# Patient Record
Sex: Male | Born: 1945 | Race: White | Hispanic: No | Marital: Married | Smoking: Former smoker
Health system: Southern US, Community
[De-identification: ages and names within clinical notes are randomized; demographics above are authoritative.]

## PROBLEM LIST (undated history)

## (undated) DIAGNOSIS — I2510 Atherosclerotic heart disease of native coronary artery without angina pectoris: Secondary | ICD-10-CM

## (undated) DIAGNOSIS — E785 Hyperlipidemia, unspecified: Secondary | ICD-10-CM

## (undated) HISTORY — DX: Hyperlipidemia, unspecified: E78.5

## (undated) HISTORY — DX: Atherosclerotic heart disease of native coronary artery without angina pectoris: I25.10

---

## 2003-12-10 ENCOUNTER — Encounter (INDEPENDENT_AMBULATORY_CARE_PROVIDER_SITE_OTHER): Payer: Self-pay | Admitting: *Deleted

## 2003-12-10 ENCOUNTER — Ambulatory Visit (HOSPITAL_BASED_OUTPATIENT_CLINIC_OR_DEPARTMENT_OTHER): Admission: RE | Admit: 2003-12-10 | Discharge: 2003-12-10 | Payer: Self-pay | Admitting: Surgery

## 2003-12-10 ENCOUNTER — Ambulatory Visit (HOSPITAL_COMMUNITY): Admission: RE | Admit: 2003-12-10 | Discharge: 2003-12-10 | Payer: Self-pay | Admitting: Surgery

## 2004-02-08 ENCOUNTER — Encounter (INDEPENDENT_AMBULATORY_CARE_PROVIDER_SITE_OTHER): Payer: Self-pay | Admitting: Specialist

## 2004-02-08 ENCOUNTER — Ambulatory Visit (HOSPITAL_COMMUNITY): Admission: RE | Admit: 2004-02-08 | Discharge: 2004-02-08 | Payer: Self-pay | Admitting: Gastroenterology

## 2006-01-04 ENCOUNTER — Emergency Department (HOSPITAL_COMMUNITY): Admission: EM | Admit: 2006-01-04 | Discharge: 2006-01-04 | Payer: Self-pay | Admitting: Emergency Medicine

## 2006-01-08 ENCOUNTER — Ambulatory Visit (HOSPITAL_COMMUNITY): Admission: RE | Admit: 2006-01-08 | Discharge: 2006-01-08 | Payer: Self-pay | Admitting: Emergency Medicine

## 2006-05-16 ENCOUNTER — Ambulatory Visit (HOSPITAL_BASED_OUTPATIENT_CLINIC_OR_DEPARTMENT_OTHER): Admission: RE | Admit: 2006-05-16 | Discharge: 2006-05-16 | Payer: Self-pay | Admitting: Surgery

## 2009-04-03 ENCOUNTER — Emergency Department (HOSPITAL_COMMUNITY): Admission: EM | Admit: 2009-04-03 | Discharge: 2009-04-03 | Payer: Self-pay | Admitting: Family Medicine

## 2011-03-10 ENCOUNTER — Other Ambulatory Visit: Payer: Self-pay | Admitting: Dermatology

## 2011-03-24 NOTE — Op Note (Signed)
NAME:  Darren Nixon, Darren Nixon                       ACCOUNT NO.:  [id number]   MEDICAL RECORD NO.:  [id number]                   PATIENT TYPE:  AMB   LOCATION:  DSC                                  FACILITY:  [facility]   PHYSICIAN:  Currie Paris, M.D.           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   PROCEDURE:  Excision of recurrent mass posterior neck.   SURGEON:  Currie Paris, M.D.   ANESTHESIA:  MAC.   INDICATIONS FOR PROCEDURE:  This patient is a 65 year old with a gradually  enlarging mass directly under the scar where a prior mass had been excised.  By history this appeared to have been a prior epidermoid cyst.   DESCRIPTION OF PROCEDURE:  The patient was seen in the holding area and had  no further questions.  The neck mass was marked and was directly posteriorly  and under the prior scar.  He was taken to the operating room and placed in  the lateral position with appropriate padding.  He was given IV sedation.  The neck was prepped and draped.  I injected about 10 mL of 1% Xylocaine  with epinephrine mixed equally with 0.5% Marcaine and for local anesthesia.  I waited about five minutes for that to have good effect.  I then made an  elliptical incision around the old scar.  The mass was palpable and I  excised it sharply with the knife staying in what appeared to be normal  fatty tissue.  There was a lot of fibrosis around this and scarring  suggesting chronic inflammatory changes.  The mass was taken out intact and  I really did not get into it, so it was unclear whether this was some  fibrotic scar tissue, recurrent lipoma, or recurrent  epidermoid cyst.  Nevertheless, it appeared to be completely excised.   Everything was dry and closed in layers with 3-0 Vicryl followed by a  running 3-0 Prolene.  Sterile dressings were applied.  The patient tolerated  the procedure well.  There were no intraoperative complications and all  counts were correct.                                               Currie Paris, M.D.   CJS/MEDQ  D:  12/10/2003  T:  12/10/2003  Job:  [id number]   cc:   Dellis Anes. Idell Pickles, M.D.  [address]  [place]  [place] [address]  Fax: [phone]

## 2011-03-24 NOTE — Op Note (Signed)
NAME:  Darren Nixon, Darren Nixon                       ACCOUNT NO.:  [id number]   MEDICAL RECORD NO.:  [id number]                   PATIENT TYPE:  AMB   LOCATION:  ENDO                                 FACILITY:  [facility]   PHYSICIAN:  Graylin Shiver, M.D.                DATE OF BIRTH:  22-Jan-1946   DATE OF PROCEDURE:  02/08/2004  DATE OF DISCHARGE:                                 OPERATIVE REPORT   PROCEDURE:  Colonoscopy with biopsy.   INDICATION:  Screening.   Informed consent was obtained after explanation of the risks of bleeding,  infection, and perforation.   PREMEDICATIONS:  1. Fentanyl 75 mcg IV.  2. Versed 7 mg IV.   DESCRIPTION OF PROCEDURE:  With the patient in the left lateral decubitus  position, a rectal exam was performed; no masses were felt.  The Olympus  colonoscope was inserted into the rectum and advanced around the colon to  the cecum.  Cecal landmarks were identified.  The cecum and ascending colon  were normal.  The transverse colon normal.  The descending colon and sigmoid  were normal.  In the proximal rectum, there was a small 4 mm sessile polyp,  biopsied off with cold forceps.  He tolerated the procedure well without  complications.   IMPRESSION:  Small rectal polyp.  Diagnosis code 211.4.                                               Graylin Shiver, M.D.    Germain Osgood  D:  02/08/2004  T:  02/09/2004  Job:  [id number]   cc:   Dellis Anes. Idell Pickles, M.D.  [address]  [place]  [place] [address]  Fax: [phone]

## 2011-03-24 NOTE — Op Note (Signed)
Darren Nixon, Darren Nixon             ACCOUNT NO.:  [id number]   MEDICAL RECORD NO.:  [id number]          PATIENT TYPE:  AMB   LOCATION:  DSC                          FACILITY:  [facility]   PHYSICIAN:  Currie Paris, M.D.DATE OF BIRTH:  14-Nov-1945   DATE OF PROCEDURE:  05/16/2006  DATE OF DISCHARGE:                                 OPERATIVE REPORT   PREOPERATIVE DIAGNOSIS:  Umbilical hernia.   POSTOPERATIVE DIAGNOSIS:  Umbilical hernia.   OPERATION:  Repair with mesh.   SURGEON:  Currie Paris, M.D.   ANESTHESIA:  General.   CLINICAL HISTORY:  Darren Nixon is a 65 year old gentleman with a reducible  umbilical hernia he wished to have fixed.   DESCRIPTION OF PROCEDURE:  The patient was seen in the holding area and he  had no further questions.  We confirmed umbilical hernia repair as the  planned procedure.   Darren Nixon was taken to the operating room and after satisfactory general  anesthesia (LMA) had been obtained, the abdomen was clipped, prepped and  draped.  The time-out occurred.   I infiltrated 0.25% plain Marcaine around the area to help with  postoperative  analgesia.  I made a curvilinear incision at the base the  umbilicus.  I elevated the umbilical skin off of the hernia sac and omentum  that was protruding through.  I cleaned the omentum off until we had it  freed up enough to be reduced fully.  I freed the fascia up from the  subcutaneous tissue so I could get good visualization of the fascia.  There  appeared to be only a single defect measuring approximately 1.5 cm across.  There was no palpable defect either above or below this.   Once this was all cleaned off and the hernia reduced, I cut a piece of mesh  and fashioned it into a cone and placed it into the defect.  The repair was  then accomplished using four sutures of 0 Prolene, putting each of those  through the top of the mesh plug so that would be incorporated into the  repair.  Everything  appeared to be dry.  I closed with  some 3-0 Vicryl, 4-0 Monocryl subcuticular.  A sterile dressing was applied  using mineral oil and a cotton ball to cover the umbilicus.  The patient  tolerated the procedure well and there were no operative complications.  All  counts were correct.      Currie Paris, M.D.  Electronically Signed     CJS/MEDQ  D:  05/16/2006  T:  05/16/2006  Job:  [id number]   cc:   L. Lupe Carney, M.D.  Fax: [phone]

## 2012-03-15 ENCOUNTER — Other Ambulatory Visit: Payer: Self-pay | Admitting: Dermatology

## 2012-03-28 ENCOUNTER — Other Ambulatory Visit: Payer: Self-pay | Admitting: Dermatology

## 2012-10-17 ENCOUNTER — Other Ambulatory Visit: Payer: Self-pay | Admitting: Dermatology

## 2013-02-10 DIAGNOSIS — H26499 Other secondary cataract, unspecified eye: Secondary | ICD-10-CM | POA: Diagnosis not present

## 2013-02-10 DIAGNOSIS — H18419 Arcus senilis, unspecified eye: Secondary | ICD-10-CM | POA: Diagnosis not present

## 2013-02-10 DIAGNOSIS — H11159 Pinguecula, unspecified eye: Secondary | ICD-10-CM | POA: Diagnosis not present

## 2013-02-10 DIAGNOSIS — H35379 Puckering of macula, unspecified eye: Secondary | ICD-10-CM | POA: Diagnosis not present

## 2013-05-15 ENCOUNTER — Other Ambulatory Visit: Payer: Self-pay | Admitting: Dermatology

## 2013-05-15 DIAGNOSIS — L989 Disorder of the skin and subcutaneous tissue, unspecified: Secondary | ICD-10-CM | POA: Diagnosis not present

## 2013-05-15 DIAGNOSIS — L570 Actinic keratosis: Secondary | ICD-10-CM | POA: Diagnosis not present

## 2013-05-15 DIAGNOSIS — D2370 Other benign neoplasm of skin of unspecified lower limb, including hip: Secondary | ICD-10-CM | POA: Diagnosis not present

## 2013-05-15 DIAGNOSIS — D239 Other benign neoplasm of skin, unspecified: Secondary | ICD-10-CM | POA: Diagnosis not present

## 2013-05-15 DIAGNOSIS — L919 Hypertrophic disorder of the skin, unspecified: Secondary | ICD-10-CM | POA: Diagnosis not present

## 2013-05-15 DIAGNOSIS — Z85828 Personal history of other malignant neoplasm of skin: Secondary | ICD-10-CM | POA: Diagnosis not present

## 2013-05-15 DIAGNOSIS — D485 Neoplasm of uncertain behavior of skin: Secondary | ICD-10-CM | POA: Diagnosis not present

## 2013-05-23 DIAGNOSIS — K1370 Unspecified lesions of oral mucosa: Secondary | ICD-10-CM | POA: Diagnosis not present

## 2013-09-16 DIAGNOSIS — H35379 Puckering of macula, unspecified eye: Secondary | ICD-10-CM | POA: Diagnosis not present

## 2013-10-16 DIAGNOSIS — B009 Herpesviral infection, unspecified: Secondary | ICD-10-CM | POA: Diagnosis not present

## 2013-10-16 DIAGNOSIS — Z0000 Encounter for general adult medical examination without abnormal findings: Secondary | ICD-10-CM | POA: Diagnosis not present

## 2013-10-16 DIAGNOSIS — M159 Polyosteoarthritis, unspecified: Secondary | ICD-10-CM | POA: Diagnosis not present

## 2013-10-16 DIAGNOSIS — E782 Mixed hyperlipidemia: Secondary | ICD-10-CM | POA: Diagnosis not present

## 2013-10-16 DIAGNOSIS — Z23 Encounter for immunization: Secondary | ICD-10-CM | POA: Diagnosis not present

## 2013-10-16 DIAGNOSIS — F430 Acute stress reaction: Secondary | ICD-10-CM | POA: Diagnosis not present

## 2013-10-16 DIAGNOSIS — N529 Male erectile dysfunction, unspecified: Secondary | ICD-10-CM | POA: Diagnosis not present

## 2013-10-16 DIAGNOSIS — Z125 Encounter for screening for malignant neoplasm of prostate: Secondary | ICD-10-CM | POA: Diagnosis not present

## 2013-10-24 ENCOUNTER — Other Ambulatory Visit: Payer: Self-pay | Admitting: Family Medicine

## 2013-10-24 DIAGNOSIS — Z87891 Personal history of nicotine dependence: Secondary | ICD-10-CM

## 2013-11-03 ENCOUNTER — Ambulatory Visit: Payer: Self-pay

## 2013-11-05 ENCOUNTER — Ambulatory Visit
Admission: RE | Admit: 2013-11-05 | Discharge: 2013-11-05 | Disposition: A | Discharge status: Home / Self Care | Payer: Medicare Other | Source: Ambulatory Visit | Attending: Family Medicine | Admitting: Family Medicine

## 2013-11-05 DIAGNOSIS — Z136 Encounter for screening for cardiovascular disorders: Secondary | ICD-10-CM | POA: Diagnosis not present

## 2013-11-05 DIAGNOSIS — Z87891 Personal history of nicotine dependence: Secondary | ICD-10-CM

## 2013-11-05 DIAGNOSIS — I77811 Abdominal aortic ectasia: Secondary | ICD-10-CM | POA: Diagnosis not present

## 2014-01-23 DIAGNOSIS — E782 Mixed hyperlipidemia: Secondary | ICD-10-CM | POA: Diagnosis not present

## 2014-03-28 DIAGNOSIS — S239XXA Sprain of unspecified parts of thorax, initial encounter: Secondary | ICD-10-CM | POA: Diagnosis not present

## 2014-03-28 DIAGNOSIS — M999 Biomechanical lesion, unspecified: Secondary | ICD-10-CM | POA: Diagnosis not present

## 2014-03-28 DIAGNOSIS — S335XXA Sprain of ligaments of lumbar spine, initial encounter: Secondary | ICD-10-CM | POA: Diagnosis not present

## 2014-03-28 DIAGNOSIS — S338XXA Sprain of other parts of lumbar spine and pelvis, initial encounter: Secondary | ICD-10-CM | POA: Diagnosis not present

## 2014-03-31 DIAGNOSIS — S239XXA Sprain of unspecified parts of thorax, initial encounter: Secondary | ICD-10-CM | POA: Diagnosis not present

## 2014-03-31 DIAGNOSIS — L570 Actinic keratosis: Secondary | ICD-10-CM | POA: Diagnosis not present

## 2014-03-31 DIAGNOSIS — B009 Herpesviral infection, unspecified: Secondary | ICD-10-CM | POA: Diagnosis not present

## 2014-03-31 DIAGNOSIS — L821 Other seborrheic keratosis: Secondary | ICD-10-CM | POA: Diagnosis not present

## 2014-03-31 DIAGNOSIS — S338XXA Sprain of other parts of lumbar spine and pelvis, initial encounter: Secondary | ICD-10-CM | POA: Diagnosis not present

## 2014-03-31 DIAGNOSIS — L989 Disorder of the skin and subcutaneous tissue, unspecified: Secondary | ICD-10-CM | POA: Diagnosis not present

## 2014-03-31 DIAGNOSIS — S335XXA Sprain of ligaments of lumbar spine, initial encounter: Secondary | ICD-10-CM | POA: Diagnosis not present

## 2014-03-31 DIAGNOSIS — M999 Biomechanical lesion, unspecified: Secondary | ICD-10-CM | POA: Diagnosis not present

## 2014-04-06 DIAGNOSIS — R5381 Other malaise: Secondary | ICD-10-CM | POA: Diagnosis not present

## 2014-04-06 DIAGNOSIS — Z79899 Other long term (current) drug therapy: Secondary | ICD-10-CM | POA: Diagnosis not present

## 2014-04-06 DIAGNOSIS — E559 Vitamin D deficiency, unspecified: Secondary | ICD-10-CM | POA: Diagnosis not present

## 2014-04-06 DIAGNOSIS — R5383 Other fatigue: Secondary | ICD-10-CM | POA: Diagnosis not present

## 2014-04-06 DIAGNOSIS — R635 Abnormal weight gain: Secondary | ICD-10-CM | POA: Diagnosis not present

## 2014-04-06 DIAGNOSIS — R0602 Shortness of breath: Secondary | ICD-10-CM | POA: Diagnosis not present

## 2014-04-07 DIAGNOSIS — S338XXA Sprain of other parts of lumbar spine and pelvis, initial encounter: Secondary | ICD-10-CM | POA: Diagnosis not present

## 2014-04-07 DIAGNOSIS — M999 Biomechanical lesion, unspecified: Secondary | ICD-10-CM | POA: Diagnosis not present

## 2014-04-07 DIAGNOSIS — S335XXA Sprain of ligaments of lumbar spine, initial encounter: Secondary | ICD-10-CM | POA: Diagnosis not present

## 2014-04-07 DIAGNOSIS — S239XXA Sprain of unspecified parts of thorax, initial encounter: Secondary | ICD-10-CM | POA: Diagnosis not present

## 2014-05-11 DIAGNOSIS — R635 Abnormal weight gain: Secondary | ICD-10-CM | POA: Diagnosis not present

## 2014-06-15 DIAGNOSIS — R635 Abnormal weight gain: Secondary | ICD-10-CM | POA: Diagnosis not present

## 2014-07-20 DIAGNOSIS — L570 Actinic keratosis: Secondary | ICD-10-CM | POA: Diagnosis not present

## 2014-07-20 DIAGNOSIS — L821 Other seborrheic keratosis: Secondary | ICD-10-CM | POA: Diagnosis not present

## 2014-07-20 DIAGNOSIS — D239 Other benign neoplasm of skin, unspecified: Secondary | ICD-10-CM | POA: Diagnosis not present

## 2014-07-20 DIAGNOSIS — Z85828 Personal history of other malignant neoplasm of skin: Secondary | ICD-10-CM | POA: Diagnosis not present

## 2014-10-26 DIAGNOSIS — Z125 Encounter for screening for malignant neoplasm of prostate: Secondary | ICD-10-CM | POA: Diagnosis not present

## 2014-10-26 DIAGNOSIS — M150 Primary generalized (osteo)arthritis: Secondary | ICD-10-CM | POA: Diagnosis not present

## 2014-10-26 DIAGNOSIS — Z0000 Encounter for general adult medical examination without abnormal findings: Secondary | ICD-10-CM | POA: Diagnosis not present

## 2014-10-26 DIAGNOSIS — E782 Mixed hyperlipidemia: Secondary | ICD-10-CM | POA: Diagnosis not present

## 2014-10-26 DIAGNOSIS — K635 Polyp of colon: Secondary | ICD-10-CM | POA: Diagnosis not present

## 2014-10-26 DIAGNOSIS — N529 Male erectile dysfunction, unspecified: Secondary | ICD-10-CM | POA: Diagnosis not present

## 2014-10-26 DIAGNOSIS — Z23 Encounter for immunization: Secondary | ICD-10-CM | POA: Diagnosis not present

## 2014-10-26 DIAGNOSIS — F419 Anxiety disorder, unspecified: Secondary | ICD-10-CM | POA: Diagnosis not present

## 2014-12-02 DIAGNOSIS — J3489 Other specified disorders of nose and nasal sinuses: Secondary | ICD-10-CM | POA: Diagnosis not present

## 2015-02-04 DIAGNOSIS — H43399 Other vitreous opacities, unspecified eye: Secondary | ICD-10-CM | POA: Diagnosis not present

## 2015-02-04 DIAGNOSIS — H524 Presbyopia: Secondary | ICD-10-CM | POA: Diagnosis not present

## 2015-02-04 DIAGNOSIS — H43819 Vitreous degeneration, unspecified eye: Secondary | ICD-10-CM | POA: Diagnosis not present

## 2015-02-04 DIAGNOSIS — H4389 Other disorders of vitreous body: Secondary | ICD-10-CM | POA: Diagnosis not present

## 2015-08-15 IMAGING — US US AORTA SCREENING (MEDICARE)
1 series · 13 of 13 positions shown · non-contrast
Comparison: None.

CLINICAL DATA: Screening, asymptomatic, previous tobacco abuse.

EXAM:
ABDOMINAL AORTA SCREENING ULTRASOUND
TECHNIQUE: Ultrasound examination of the abdominal aorta was performed as a
screening evaluation for abdominal aortic aneurysm.

[Series 1: us aorta screening (medicare) · 0.37mm/px · 13 of 13 slices shown]
[im 1/13]
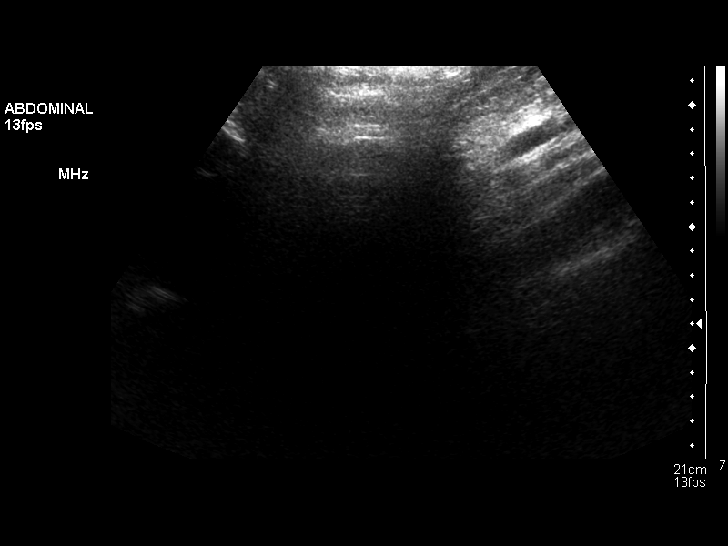
[im 2/13]
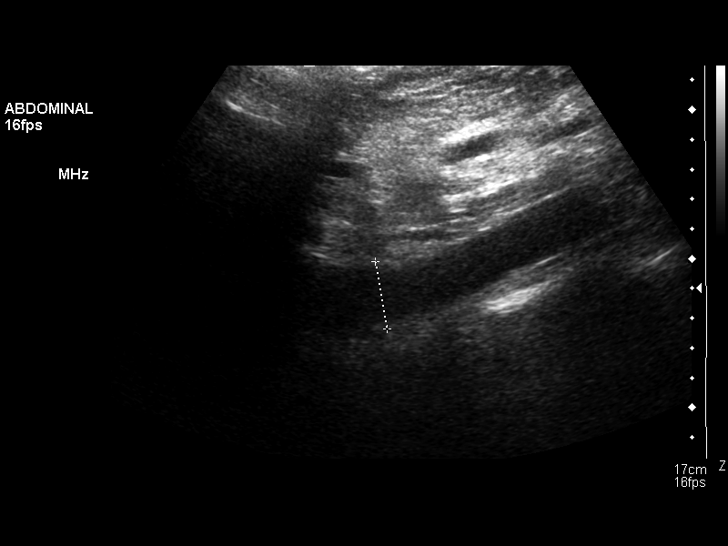
[im 3/13]
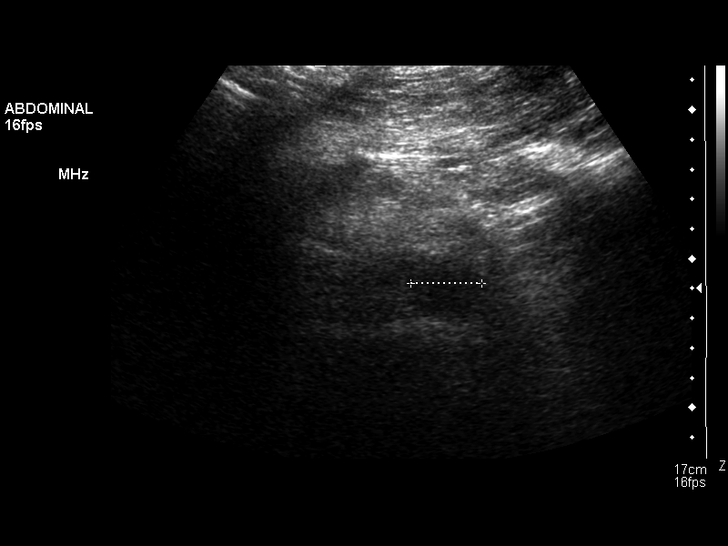
[im 4/13]
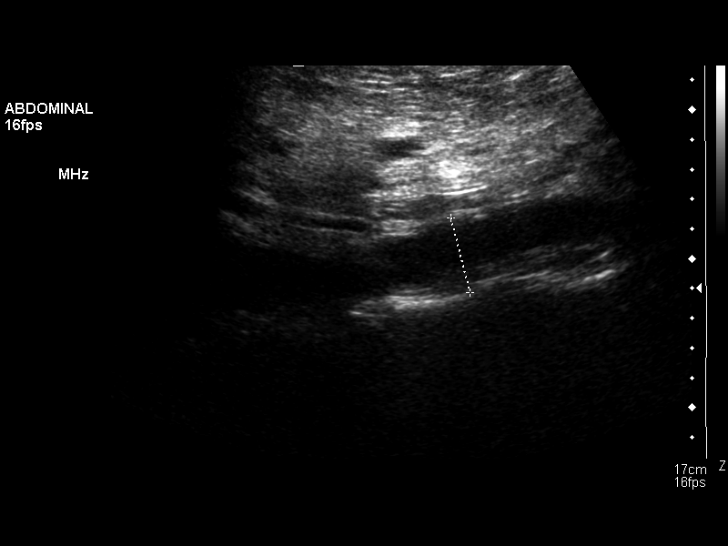
[im 5/13]
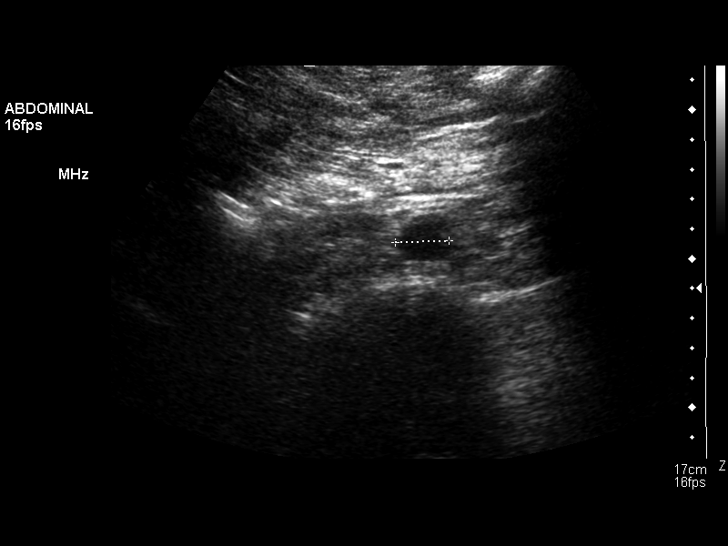
[im 6/13]
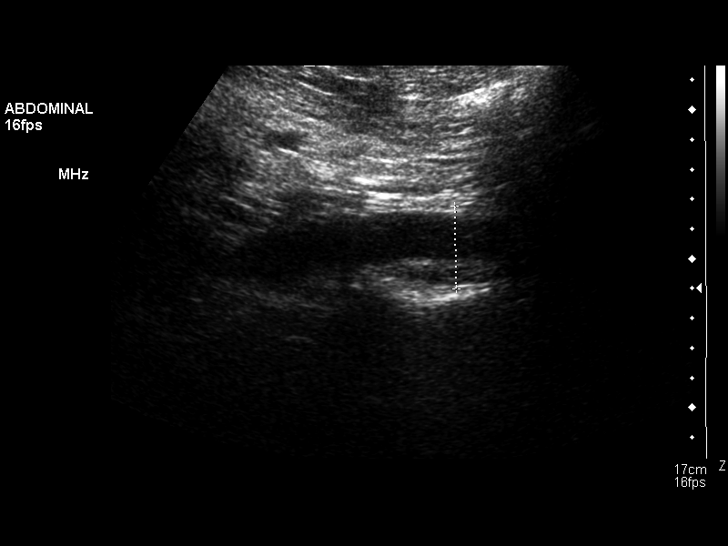
[im 7/13]
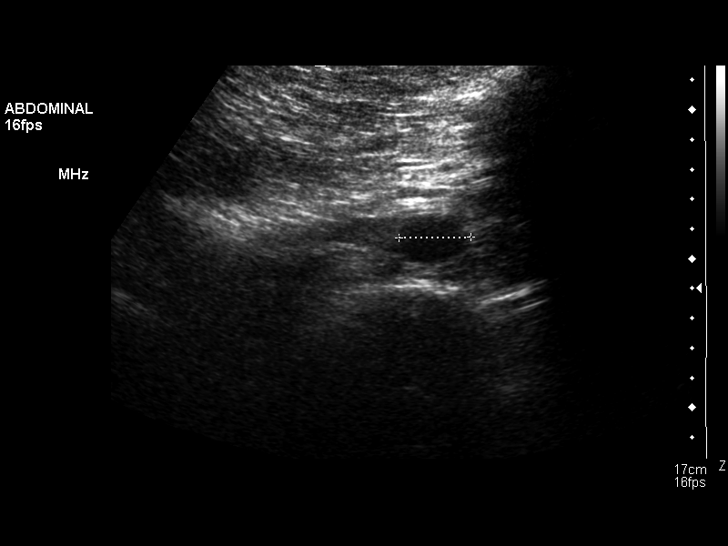
[im 8/13]
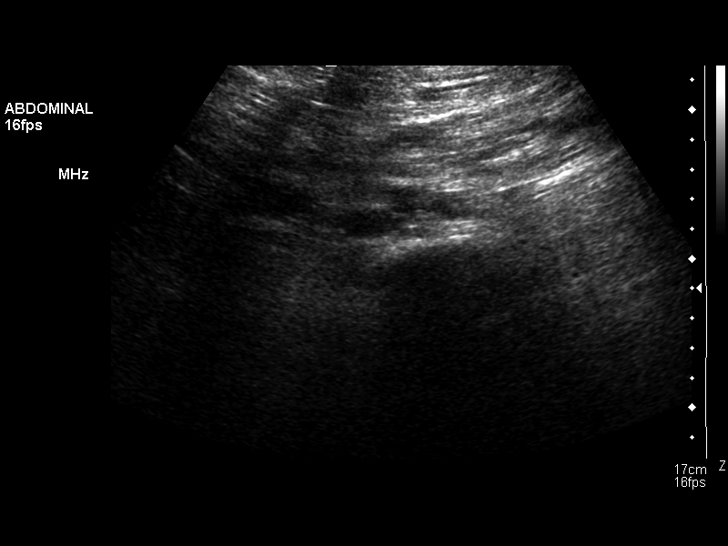
[im 9/13]
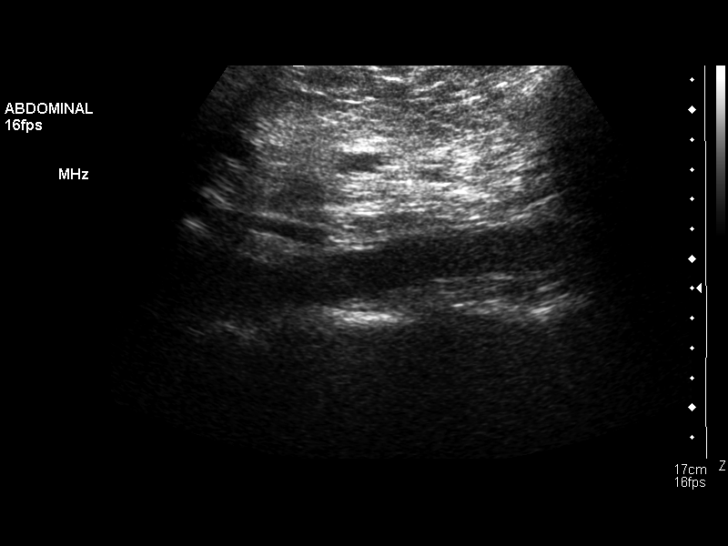
[im 10/13]
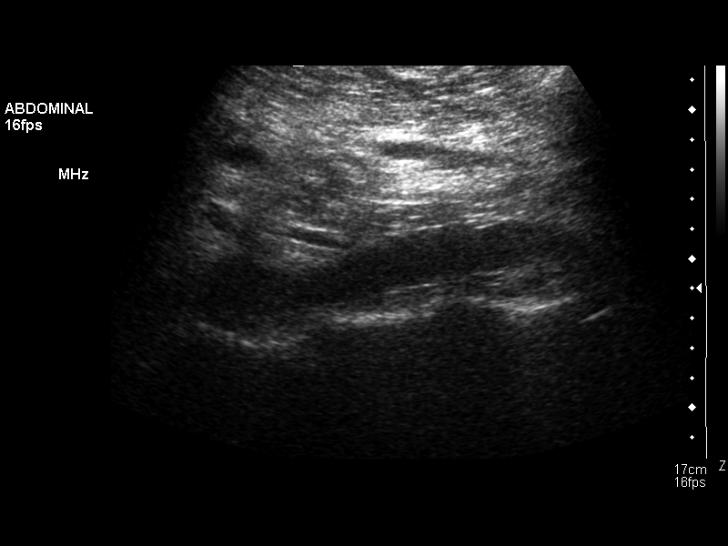
[im 11/13]
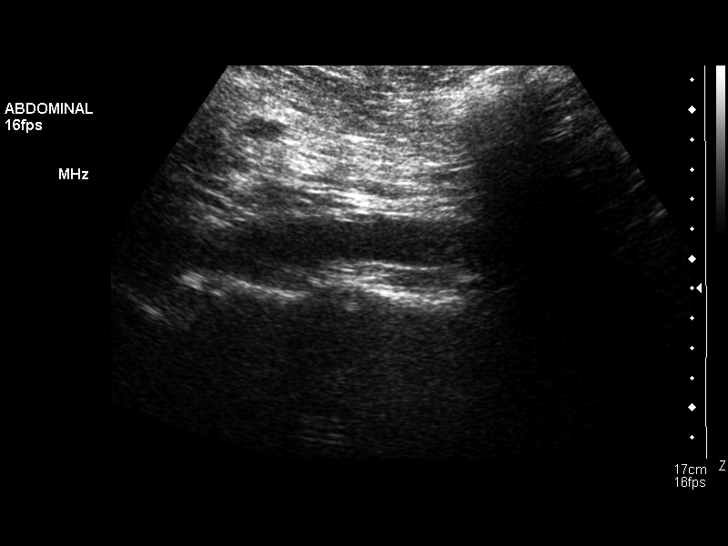
[im 12/13]
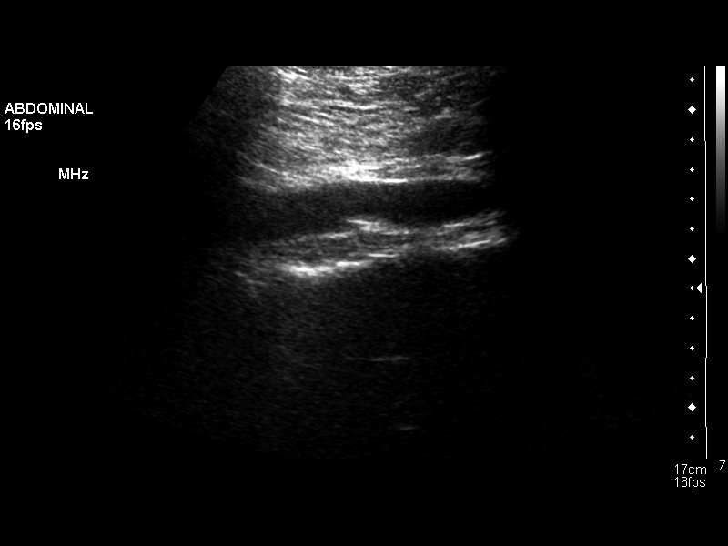
[im 13/13]
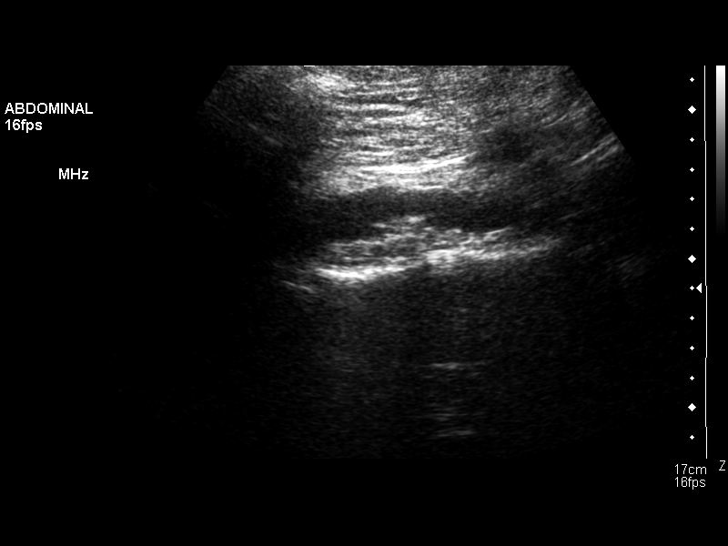

[13 of 13 positions shown; findings below may reference images not displayed]

FINDINGS: Abdominal Aorta: Fusiform distal segment. Scattered atheromatous
plaque.

Maximum AP diameter:  Distal abdominal aorta, 2.8 cm

Maximum TRV diameter:   2.4 cm
IMPRESSION: Ectatic abdominal aorta at risk for aneurysm development. Recommend
follow up by US in 5 years. This recommendation follows ACR
consensus guidelines: White Paper of the ACR Incidental Findings
Committee II on Vascular Findings. [HOSPITAL] 1138;

## 2015-08-17 DIAGNOSIS — L821 Other seborrheic keratosis: Secondary | ICD-10-CM | POA: Diagnosis not present

## 2015-08-17 DIAGNOSIS — D2272 Melanocytic nevi of left lower limb, including hip: Secondary | ICD-10-CM | POA: Diagnosis not present

## 2015-08-17 DIAGNOSIS — D225 Melanocytic nevi of trunk: Secondary | ICD-10-CM | POA: Diagnosis not present

## 2015-08-17 DIAGNOSIS — L570 Actinic keratosis: Secondary | ICD-10-CM | POA: Diagnosis not present

## 2015-08-17 DIAGNOSIS — Z85828 Personal history of other malignant neoplasm of skin: Secondary | ICD-10-CM | POA: Diagnosis not present

## 2015-10-08 DIAGNOSIS — B349 Viral infection, unspecified: Secondary | ICD-10-CM | POA: Diagnosis not present

## 2015-11-03 DIAGNOSIS — Z125 Encounter for screening for malignant neoplasm of prostate: Secondary | ICD-10-CM | POA: Diagnosis not present

## 2015-11-03 DIAGNOSIS — Z23 Encounter for immunization: Secondary | ICD-10-CM | POA: Diagnosis not present

## 2015-11-03 DIAGNOSIS — M150 Primary generalized (osteo)arthritis: Secondary | ICD-10-CM | POA: Diagnosis not present

## 2015-11-03 DIAGNOSIS — E782 Mixed hyperlipidemia: Secondary | ICD-10-CM | POA: Diagnosis not present

## 2015-11-03 DIAGNOSIS — Z0000 Encounter for general adult medical examination without abnormal findings: Secondary | ICD-10-CM | POA: Diagnosis not present

## 2015-11-03 DIAGNOSIS — F419 Anxiety disorder, unspecified: Secondary | ICD-10-CM | POA: Diagnosis not present

## 2015-11-10 DIAGNOSIS — L814 Other melanin hyperpigmentation: Secondary | ICD-10-CM | POA: Diagnosis not present

## 2015-11-10 DIAGNOSIS — L821 Other seborrheic keratosis: Secondary | ICD-10-CM | POA: Diagnosis not present

## 2015-11-10 DIAGNOSIS — L570 Actinic keratosis: Secondary | ICD-10-CM | POA: Diagnosis not present

## 2015-11-10 DIAGNOSIS — Z23 Encounter for immunization: Secondary | ICD-10-CM | POA: Diagnosis not present

## 2015-12-09 DIAGNOSIS — M5134 Other intervertebral disc degeneration, thoracic region: Secondary | ICD-10-CM | POA: Diagnosis not present

## 2015-12-09 DIAGNOSIS — M5137 Other intervertebral disc degeneration, lumbosacral region: Secondary | ICD-10-CM | POA: Diagnosis not present

## 2015-12-09 DIAGNOSIS — M9903 Segmental and somatic dysfunction of lumbar region: Secondary | ICD-10-CM | POA: Diagnosis not present

## 2015-12-09 DIAGNOSIS — M9905 Segmental and somatic dysfunction of pelvic region: Secondary | ICD-10-CM | POA: Diagnosis not present

## 2015-12-09 DIAGNOSIS — M5431 Sciatica, right side: Secondary | ICD-10-CM | POA: Diagnosis not present

## 2015-12-09 DIAGNOSIS — M9902 Segmental and somatic dysfunction of thoracic region: Secondary | ICD-10-CM | POA: Diagnosis not present

## 2016-05-15 DIAGNOSIS — E782 Mixed hyperlipidemia: Secondary | ICD-10-CM | POA: Diagnosis not present

## 2016-05-16 DIAGNOSIS — L814 Other melanin hyperpigmentation: Secondary | ICD-10-CM | POA: Diagnosis not present

## 2016-05-16 DIAGNOSIS — L570 Actinic keratosis: Secondary | ICD-10-CM | POA: Diagnosis not present

## 2016-05-16 DIAGNOSIS — L821 Other seborrheic keratosis: Secondary | ICD-10-CM | POA: Diagnosis not present

## 2016-08-15 DIAGNOSIS — Z23 Encounter for immunization: Secondary | ICD-10-CM | POA: Diagnosis not present

## 2016-09-29 DIAGNOSIS — J069 Acute upper respiratory infection, unspecified: Secondary | ICD-10-CM | POA: Diagnosis not present

## 2016-09-29 DIAGNOSIS — R05 Cough: Secondary | ICD-10-CM | POA: Diagnosis not present

## 2016-11-07 ENCOUNTER — Ambulatory Visit
Admission: RE | Admit: 2016-11-07 | Discharge: 2016-11-07 | Disposition: A | Discharge status: Home / Self Care | Payer: Medicare Other | Source: Ambulatory Visit | Attending: Family Medicine | Admitting: Family Medicine

## 2016-11-07 ENCOUNTER — Other Ambulatory Visit: Payer: Self-pay | Admitting: Family Medicine

## 2016-11-07 DIAGNOSIS — Z125 Encounter for screening for malignant neoplasm of prostate: Secondary | ICD-10-CM | POA: Diagnosis not present

## 2016-11-07 DIAGNOSIS — N5089 Other specified disorders of the male genital organs: Secondary | ICD-10-CM | POA: Diagnosis not present

## 2016-11-07 DIAGNOSIS — R609 Edema, unspecified: Secondary | ICD-10-CM

## 2016-11-07 DIAGNOSIS — G5622 Lesion of ulnar nerve, left upper limb: Secondary | ICD-10-CM | POA: Diagnosis not present

## 2016-11-07 DIAGNOSIS — Z0000 Encounter for general adult medical examination without abnormal findings: Secondary | ICD-10-CM | POA: Diagnosis not present

## 2016-11-07 DIAGNOSIS — Z86010 Personal history of colonic polyps: Secondary | ICD-10-CM | POA: Diagnosis not present

## 2016-11-07 DIAGNOSIS — F419 Anxiety disorder, unspecified: Secondary | ICD-10-CM | POA: Diagnosis not present

## 2016-11-07 DIAGNOSIS — E782 Mixed hyperlipidemia: Secondary | ICD-10-CM | POA: Diagnosis not present

## 2016-11-07 DIAGNOSIS — M720 Palmar fascial fibromatosis [Dupuytren]: Secondary | ICD-10-CM | POA: Diagnosis not present

## 2016-11-07 DIAGNOSIS — M150 Primary generalized (osteo)arthritis: Secondary | ICD-10-CM | POA: Diagnosis not present

## 2016-12-27 DIAGNOSIS — N433 Hydrocele, unspecified: Secondary | ICD-10-CM | POA: Diagnosis not present

## 2016-12-27 DIAGNOSIS — N5201 Erectile dysfunction due to arterial insufficiency: Secondary | ICD-10-CM | POA: Diagnosis not present

## 2017-01-01 DIAGNOSIS — D126 Benign neoplasm of colon, unspecified: Secondary | ICD-10-CM | POA: Diagnosis not present

## 2017-01-01 DIAGNOSIS — Z1211 Encounter for screening for malignant neoplasm of colon: Secondary | ICD-10-CM | POA: Diagnosis not present

## 2017-01-01 DIAGNOSIS — Z86010 Personal history of colonic polyps: Secondary | ICD-10-CM | POA: Diagnosis not present

## 2017-01-01 DIAGNOSIS — K635 Polyp of colon: Secondary | ICD-10-CM | POA: Diagnosis not present

## 2017-01-04 DIAGNOSIS — D126 Benign neoplasm of colon, unspecified: Secondary | ICD-10-CM | POA: Diagnosis not present

## 2017-09-19 DIAGNOSIS — Z23 Encounter for immunization: Secondary | ICD-10-CM | POA: Diagnosis not present

## 2017-12-03 DIAGNOSIS — Z1159 Encounter for screening for other viral diseases: Secondary | ICD-10-CM | POA: Diagnosis not present

## 2017-12-03 DIAGNOSIS — M150 Primary generalized (osteo)arthritis: Secondary | ICD-10-CM | POA: Diagnosis not present

## 2017-12-03 DIAGNOSIS — R030 Elevated blood-pressure reading, without diagnosis of hypertension: Secondary | ICD-10-CM | POA: Diagnosis not present

## 2017-12-03 DIAGNOSIS — F419 Anxiety disorder, unspecified: Secondary | ICD-10-CM | POA: Diagnosis not present

## 2017-12-03 DIAGNOSIS — Z125 Encounter for screening for malignant neoplasm of prostate: Secondary | ICD-10-CM | POA: Diagnosis not present

## 2017-12-03 DIAGNOSIS — M720 Palmar fascial fibromatosis [Dupuytren]: Secondary | ICD-10-CM | POA: Diagnosis not present

## 2017-12-03 DIAGNOSIS — E782 Mixed hyperlipidemia: Secondary | ICD-10-CM | POA: Diagnosis not present

## 2017-12-03 DIAGNOSIS — Z0000 Encounter for general adult medical examination without abnormal findings: Secondary | ICD-10-CM | POA: Diagnosis not present

## 2018-03-09 DIAGNOSIS — J019 Acute sinusitis, unspecified: Secondary | ICD-10-CM | POA: Diagnosis not present

## 2018-12-26 DIAGNOSIS — M720 Palmar fascial fibromatosis [Dupuytren]: Secondary | ICD-10-CM | POA: Diagnosis not present

## 2018-12-26 DIAGNOSIS — G250 Essential tremor: Secondary | ICD-10-CM | POA: Diagnosis not present

## 2018-12-26 DIAGNOSIS — B001 Herpesviral vesicular dermatitis: Secondary | ICD-10-CM | POA: Diagnosis not present

## 2018-12-26 DIAGNOSIS — Z0000 Encounter for general adult medical examination without abnormal findings: Secondary | ICD-10-CM | POA: Diagnosis not present

## 2018-12-26 DIAGNOSIS — E782 Mixed hyperlipidemia: Secondary | ICD-10-CM | POA: Diagnosis not present

## 2018-12-26 DIAGNOSIS — M150 Primary generalized (osteo)arthritis: Secondary | ICD-10-CM | POA: Diagnosis not present

## 2018-12-26 DIAGNOSIS — Z125 Encounter for screening for malignant neoplasm of prostate: Secondary | ICD-10-CM | POA: Diagnosis not present

## 2018-12-26 DIAGNOSIS — F419 Anxiety disorder, unspecified: Secondary | ICD-10-CM | POA: Diagnosis not present

## 2018-12-26 DIAGNOSIS — E669 Obesity, unspecified: Secondary | ICD-10-CM | POA: Diagnosis not present

## 2019-06-30 DIAGNOSIS — R972 Elevated prostate specific antigen [PSA]: Secondary | ICD-10-CM | POA: Diagnosis not present

## 2019-07-07 DIAGNOSIS — Z23 Encounter for immunization: Secondary | ICD-10-CM | POA: Diagnosis not present

## 2019-08-11 DIAGNOSIS — L820 Inflamed seborrheic keratosis: Secondary | ICD-10-CM | POA: Diagnosis not present

## 2019-08-11 DIAGNOSIS — Z23 Encounter for immunization: Secondary | ICD-10-CM | POA: Diagnosis not present

## 2019-08-11 DIAGNOSIS — L821 Other seborrheic keratosis: Secondary | ICD-10-CM | POA: Diagnosis not present

## 2019-08-11 DIAGNOSIS — L570 Actinic keratosis: Secondary | ICD-10-CM | POA: Diagnosis not present

## 2019-10-14 DIAGNOSIS — L821 Other seborrheic keratosis: Secondary | ICD-10-CM | POA: Diagnosis not present

## 2019-10-14 DIAGNOSIS — Z85828 Personal history of other malignant neoplasm of skin: Secondary | ICD-10-CM | POA: Diagnosis not present

## 2019-10-14 DIAGNOSIS — D485 Neoplasm of uncertain behavior of skin: Secondary | ICD-10-CM | POA: Diagnosis not present

## 2019-10-14 DIAGNOSIS — L570 Actinic keratosis: Secondary | ICD-10-CM | POA: Diagnosis not present

## 2019-10-14 DIAGNOSIS — Z23 Encounter for immunization: Secondary | ICD-10-CM | POA: Diagnosis not present

## 2019-10-14 DIAGNOSIS — D225 Melanocytic nevi of trunk: Secondary | ICD-10-CM | POA: Diagnosis not present

## 2019-10-14 DIAGNOSIS — D239 Other benign neoplasm of skin, unspecified: Secondary | ICD-10-CM | POA: Diagnosis not present

## 2019-10-14 DIAGNOSIS — L814 Other melanin hyperpigmentation: Secondary | ICD-10-CM | POA: Diagnosis not present

## 2019-12-21 DIAGNOSIS — Z23 Encounter for immunization: Secondary | ICD-10-CM | POA: Diagnosis not present

## 2020-01-12 DIAGNOSIS — R972 Elevated prostate specific antigen [PSA]: Secondary | ICD-10-CM | POA: Diagnosis not present

## 2020-01-12 DIAGNOSIS — M150 Primary generalized (osteo)arthritis: Secondary | ICD-10-CM | POA: Diagnosis not present

## 2020-01-12 DIAGNOSIS — E669 Obesity, unspecified: Secondary | ICD-10-CM | POA: Diagnosis not present

## 2020-01-12 DIAGNOSIS — E782 Mixed hyperlipidemia: Secondary | ICD-10-CM | POA: Diagnosis not present

## 2020-01-12 DIAGNOSIS — M720 Palmar fascial fibromatosis [Dupuytren]: Secondary | ICD-10-CM | POA: Diagnosis not present

## 2020-01-12 DIAGNOSIS — F419 Anxiety disorder, unspecified: Secondary | ICD-10-CM | POA: Diagnosis not present

## 2020-01-12 DIAGNOSIS — Z0000 Encounter for general adult medical examination without abnormal findings: Secondary | ICD-10-CM | POA: Diagnosis not present

## 2020-01-12 DIAGNOSIS — B001 Herpesviral vesicular dermatitis: Secondary | ICD-10-CM | POA: Diagnosis not present

## 2020-01-18 DIAGNOSIS — Z23 Encounter for immunization: Secondary | ICD-10-CM | POA: Diagnosis not present

## 2020-09-24 DIAGNOSIS — Z23 Encounter for immunization: Secondary | ICD-10-CM | POA: Diagnosis not present

## 2020-10-18 DIAGNOSIS — L918 Other hypertrophic disorders of the skin: Secondary | ICD-10-CM | POA: Diagnosis not present

## 2020-10-18 DIAGNOSIS — L814 Other melanin hyperpigmentation: Secondary | ICD-10-CM | POA: Diagnosis not present

## 2020-10-18 DIAGNOSIS — L570 Actinic keratosis: Secondary | ICD-10-CM | POA: Diagnosis not present

## 2020-10-18 DIAGNOSIS — L821 Other seborrheic keratosis: Secondary | ICD-10-CM | POA: Diagnosis not present

## 2020-10-18 DIAGNOSIS — Z85828 Personal history of other malignant neoplasm of skin: Secondary | ICD-10-CM | POA: Diagnosis not present

## 2020-10-18 DIAGNOSIS — L578 Other skin changes due to chronic exposure to nonionizing radiation: Secondary | ICD-10-CM | POA: Diagnosis not present

## 2020-10-18 DIAGNOSIS — D225 Melanocytic nevi of trunk: Secondary | ICD-10-CM | POA: Diagnosis not present

## 2020-12-01 ENCOUNTER — Other Ambulatory Visit: Payer: Medicare Other

## 2020-12-01 DIAGNOSIS — Z20822 Contact with and (suspected) exposure to covid-19: Secondary | ICD-10-CM | POA: Diagnosis not present

## 2020-12-02 ENCOUNTER — Other Ambulatory Visit: Payer: Medicare Other

## 2020-12-02 LAB — SARS-COV-2, NAA 2 DAY TAT

## 2020-12-02 LAB — NOVEL CORONAVIRUS, NAA: SARS-CoV-2, NAA: NOT DETECTED

## 2020-12-22 ENCOUNTER — Other Ambulatory Visit: Payer: Self-pay | Admitting: Physician Assistant

## 2020-12-22 ENCOUNTER — Ambulatory Visit
Admission: RE | Admit: 2020-12-22 | Discharge: 2020-12-22 | Disposition: A | Discharge status: Home / Self Care | Payer: Medicare Other | Source: Ambulatory Visit | Attending: Physician Assistant | Admitting: Physician Assistant

## 2020-12-22 DIAGNOSIS — I440 Atrioventricular block, first degree: Secondary | ICD-10-CM | POA: Diagnosis not present

## 2020-12-22 DIAGNOSIS — R001 Bradycardia, unspecified: Secondary | ICD-10-CM | POA: Diagnosis not present

## 2020-12-22 DIAGNOSIS — R0789 Other chest pain: Secondary | ICD-10-CM

## 2020-12-22 DIAGNOSIS — R079 Chest pain, unspecified: Secondary | ICD-10-CM | POA: Diagnosis not present

## 2020-12-22 DIAGNOSIS — E782 Mixed hyperlipidemia: Secondary | ICD-10-CM | POA: Diagnosis not present

## 2021-01-09 NOTE — Progress Notes (Signed)
Cardiology Office Note:   Date:  01/10/2021  NAME:  Darren Nixon    MRN: [id number] DOB:  1946/04/02   PCP:  Alroy Dust, L.Marlou Sa, MD  Cardiologist:  No primary care provider on file.   Referring MD: Lennie Odor, PA   Chief Complaint  Patient presents with  . Chest Pain   History of Present Illness:   Darren Nixon is a 75 y.o. male with a hx of hyperlipidemia who is being seen today for the evaluation of chest pain/abnormal EKG at the request of [place], [place], [place].  He reports for the last 6 weeks he has had 6-7 episodes of jaw pain and chest tightness.  He reports symptoms can occur mainly in the mornings.  They occur at rest.  He describes a tightening in his jaw.  Also reports pressure in his chest.  Symptoms last 2 to 3 minutes and resolve without intervention.  He reports no identifiable trigger.  No alleviating factors.  He reports he does not get the pain when he exerts himself.  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  He reports he does heavy lifting at home working around the house.  He has no symptoms of pressure or chest pain when he does that.  He does report a lot of stress at work.  He also reports anxiety has been an issue lately.  He is taking more Xanax than usual.  He reports symptoms have bothered him because he is concerned about having a heart attack.  EKG in office demonstrates heart rate 63 with no acute ischemic changes or evidence of infarction.  His EKG is normal.  Most recent LDL cholesterol 137.  He reports he frequently forgets to take his Lipitor.  BP in office 134/64.  He is a former smoker of 20 years but quit 30 years ago.  He has never had a heart attack or stroke.  He reports heart disease in his father.  This appears to be congestive heart failure.  He is married.  He has 3 children.  His wife presents with him today.  She has 2 children from a previous marriage.  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  Symptoms  have bothered him.  He wants to make sure his heart is okay.  He does have a faint 2 out of 6 systolic ejection murmur on examination has never been evaluated.  Problem list 1.  Hyperlipidemia -Total cholesterol 225, HDL 69, LDL 137, triglycerides 106  Past Medical History: Past Medical History:  Diagnosis Date  . Hyperlipidemia     Past Surgical History: History reviewed. No pertinent surgical history.  Current Medications: Current Meds  Medication Sig  . ALPRAZolam (XANAX) 0.25 MG tablet Take 0.25 mg by mouth at bedtime as needed for anxiety.  Marland Kitchen aspirin EC 81 MG tablet Take 81 mg by mouth daily. Swallow whole.  Marland Kitchen atorvastatin (LIPITOR) 40 MG tablet Take 40 mg by mouth daily.  . famciclovir (FAMVIR) 500 MG tablet Take 500 mg by mouth 2 (two) times daily. Take 2 tablets by mouth twice daily for fever blister as needed.  . fluticasone (FLONASE) 50 MCG/ACT nasal spray Place into both nostrils daily.  . meloxicam (MOBIC) 15 MG tablet Take 15 mg by mouth daily.  . metoprolol tartrate (LOPRESSOR) 50 MG tablet Take 1 tablet by mouth once for procedure.  . Omega-3 Fatty Acids (FISH OIL) 1000 MG CAPS Take by mouth.  . Probiotic  Product (PROBIOTIC ADVANCED PO) Take by mouth.  . tadalafil (CIALIS) 5 MG tablet Take 5 mg by mouth daily as needed for erectile dysfunction.     Allergies:    Latex   Social History: Social History   Socioeconomic History  . Marital status: Married    Spouse name: Not on file  . Number of children: 3  . Years of education: Not on file  . Highest education level: Not on file  Occupational History  . Occupation: Games developer  Tobacco Use  . Smoking status: Former Smoker    Packs/day: 1.00    Years: 20.00    Pack years: 20.00  . Smokeless tobacco: Never Used  Substance and Sexual Activity  . Alcohol use: Yes  . Drug use: Never  . Sexual activity: Not on file  Other Topics Concern  . Not on file  Social History Narrative  . Not on file    Social Determinants of Health   Financial Resource Strain: Not on file  Food Insecurity: Not on file  Transportation Needs: Not on file  Physical Activity: Not on file  Stress: Not on file  Social Connections: Not on file     Family History: The patient's family history includes Heart attack in his father; Heart failure in his father; Kidney disease in his brother.  ROS:   All other ROS reviewed and negative. Pertinent positives noted in the HPI.     EKGs/Labs/Other Studies Reviewed:   The following studies were personally reviewed by me today:  EKG:  EKG is ordered today.  The ekg ordered today demonstrates normal sinus rhythm heart rate 63, no acute ischemic changes or evidence of infarction, normal intervals, and was personally reviewed by me.   Recent Labs: No results found for requested labs within last 8760 hours.   Recent Lipid Panel No results found for: CHOL, TRIG, HDL, CHOLHDL, VLDL, LDLCALC, LDLDIRECT  Physical Exam:   VS:  BP 134/64   Pulse 63   Ht 6\' 1"  (1.854 m)   Wt 233 lb (105.7 kg)   SpO2 97%   BMI 30.74 kg/m    Wt Readings from Last 3 Encounters:  01/10/21 233 lb (105.7 kg)    General: Well nourished, well developed, in no acute distress Head: Atraumatic, normal size  Eyes: PEERLA, EOMI  Neck: Supple, no JVD Endocrine: No thryomegaly Cardiac: Normal S1, S2; RRR; no murmurs, rubs, or gallops Lungs: Clear to auscultation bilaterally, no wheezing, rhonchi or rales  Abd: Soft, nontender, no hepatomegaly  Ext: No edema, pulses 2+ Musculoskeletal: No deformities, BUE and BLE strength normal and equal Skin: Warm and dry, no rashes   Neuro: Alert and oriented to person, place, time, and situation, CNII-XII grossly intact, no focal deficits  Psych: Normal mood and affect   ASSESSMENT:   Darren Nixon is a 75 y.o. male who presents for the following: 1. Chest pain, unspecified type   2. Nonspecific abnormal electrocardiogram (ECG) (EKG)   3.  Murmur   4. Abnormal findings on diagnostic imaging of heart and coronary circulation      PLAN:   1. Chest pain, unspecified type 2. Nonspecific abnormal electrocardiogram (ECG) (EKG) 3. Murmur -Atypical chest pain.  Symptoms occur at rest.  Includes jaw pain and chest pressure.  Not associated with activity or alleviated by rest.  EKG in office demonstrates no acute ischemic change or evidence of infarction.  CVD risk factors include hyperlipidemia, former tobacco abuse and family history of heart disease. -  I have recommended a coronary CTA for further evaluation.  He will take 50 mg of metoprolol tartrate 2 hours before the scan.  He also has a faint 2 out of 6 systolic ejection murmur.  I suspect this is aortic valve sclerosis versus mild stenosis.  He needs an echocardiogram.  He will see me back in 3 months after the above work-up.  May need to intensify lipid-lowering therapy.  4. Abnormal findings on diagnostic imaging of heart and coronary circulation  -This diagnosis is part of his CTA order.   Disposition: Return in about 3 months (around 04/12/2021).  Medication Adjustments/Labs and Tests Ordered: Current medicines are reviewed at length with the patient today.  Concerns regarding medicines are outlined above.  Orders Placed This Encounter  Procedures  . CT CORONARY MORPH W/CTA COR W/SCORE W/CA W/CM &/OR WO/CM  . CT CORONARY FRACTIONAL FLOW RESERVE DATA PREP  . CT CORONARY FRACTIONAL FLOW RESERVE FLUID ANALYSIS  . Basic metabolic panel  . EKG 12-Lead  . ECHOCARDIOGRAM COMPLETE   Meds ordered this encounter  Medications  . metoprolol tartrate (LOPRESSOR) 50 MG tablet    Sig: Take 1 tablet by mouth once for procedure.    Dispense:  1 tablet    Refill:  0    Patient Instructions  Medication Instructions:  Take Metoprolol 50 mg two hours before the CT scan when scheduled.  *If you need a refill on your cardiac medications before your next appointment, please call your  pharmacy*   Lab Work: BMET today  If you have labs (blood work) drawn today and your tests are completely normal, you will receive your results only by: Marland Kitchen MyChart Message (if you have MyChart) OR . A paper copy in the mail If you have any lab test that is abnormal or we need to change your treatment, we will call you to review the results.   Testing/Procedures: Echocardiogram - Your physician has requested that you have an echocardiogram. Echocardiography is a painless test that uses sound waves to create images of your heart. It provides your doctor with information about the size and shape of your heart and how well your heart's chambers and valves are working. This procedure takes approximately one hour. There are no restrictions for this procedure. This will be performed at our [facility] location - [address].  Your physician has requested that you have cardiac CT. Cardiac computed tomography (CT) is a painless test that uses an x-ray machine to take clear, detailed pictures of your heart. For further information please visit [email or web address]. Please follow instruction sheet as given.    Follow-Up: At [facility], you and your health needs are our priority.  As part of our continuing mission to provide you with exceptional heart care, we have created designated Provider Care Teams.  These Care Teams include your primary Cardiologist (physician) and Advanced Practice Providers (APPs -  Physician Assistants and Nurse Practitioners) who all work together to provide you with the care you need, when you need it.  We recommend signing up for the patient portal called "MyChart".  Sign up information is provided on this After Visit Summary.  MyChart is used to connect with patients for Virtual Visits (Telemedicine).  Patients are able to view lab/test results, encounter notes, upcoming appointments, etc.  Non-urgent messages can be sent to your provider as well.   To learn  more about what you can do with MyChart, go to [email or web address].    Your  next appointment:   3 month(s)  The format for your next appointment:   In Person  Provider:   Eleonore Chiquito, MD   Other Instructions Your cardiac CT will be scheduled at one of the below locations:   [facility] [address] [place], [place] [address] [phone]   If scheduled at [facility], please arrive at the [facility] main entrance (entrance A) of [facility] 30 minutes prior to test start time. Proceed to the [facility] Radiology Department (first floor) to check-in and test prep.  Please follow these instructions carefully (unless otherwise directed):  Hold all erectile dysfunction medications at least 3 days (72 hrs) prior to test.  On the Night Before the Test: . Be sure to Drink plenty of water. . Do not consume any caffeinated/decaffeinated beverages or chocolate 12 hours prior to your test. . Do not take any antihistamines 12 hours prior to your test.  On the Day of the Test: . Drink plenty of water until 1 hour prior to the test. . Do not eat any food 4 hours prior to the test. . You may take your regular medications prior to the test.  . Take metoprolol (Lopressor) two hours prior to test. . HOLD Furosemide/Hydrochlorothiazide morning of the test. . FEMALES- please wear underwire-free bra if available      After the Test: . Drink plenty of water. . After receiving IV contrast, you may experience a mild flushed feeling. This is normal. . On occasion, you may experience a mild rash up to 24 hours after the test. This is not dangerous. If this occurs, you can take Benadryl 25 mg and increase your fluid intake. . If you experience trouble breathing, this can be serious. If it is severe call 911 IMMEDIATELY. If it is mild, please call our office. . If you take any of these medications: Glipizide/Metformin, Avandament, Glucavance, please do not  take 48 hours after completing test unless otherwise instructed.   Once we have confirmed authorization from your insurance company, we will call you to set up a date and time for your test. Based on how quickly your insurance processes prior authorizations requests, please allow up to 4 weeks to be contacted for scheduling your Cardiac CT appointment. Be advised that routine Cardiac CT appointments could be scheduled as many as 8 weeks after your provider has ordered it.  For non-scheduling related questions, please contact the cardiac imaging nurse navigator should you have any questions/concerns: Marchia Bond, Cardiac Imaging Nurse Navigator Gordy Clement, Cardiac Imaging Nurse Navigator  Heart and Vascular Services Direct Office Dial: [phone]   For scheduling needs, including cancellations and rescheduling, please call [place], [phone].       Signed, Addison Naegeli. Audie Box, [place]  [address] [place], [place] [address] [phone]  01/10/2021 10:05 AM

## 2021-01-10 ENCOUNTER — Ambulatory Visit (INDEPENDENT_AMBULATORY_CARE_PROVIDER_SITE_OTHER): Payer: Medicare Other | Admitting: Cardiovascular Disease

## 2021-01-10 ENCOUNTER — Encounter: Payer: Self-pay | Admitting: Cardiovascular Disease

## 2021-01-10 ENCOUNTER — Other Ambulatory Visit: Payer: Self-pay

## 2021-01-10 VITALS — BP 134/64 | HR 63 | Ht 73.0 in | Wt 233.0 lb

## 2021-01-10 DIAGNOSIS — R011 Cardiac murmur, unspecified: Secondary | ICD-10-CM

## 2021-01-10 DIAGNOSIS — R931 Abnormal findings on diagnostic imaging of heart and coronary circulation: Secondary | ICD-10-CM | POA: Diagnosis not present

## 2021-01-10 DIAGNOSIS — R079 Chest pain, unspecified: Secondary | ICD-10-CM

## 2021-01-10 DIAGNOSIS — R9431 Abnormal electrocardiogram [ECG] [EKG]: Secondary | ICD-10-CM | POA: Diagnosis not present

## 2021-01-10 LAB — BASIC METABOLIC PANEL
BUN/Creatinine Ratio: 21 (ref 10–24)
BUN: 19 mg/dL (ref 8–27)
CO2: 22 mmol/L (ref 20–29)
Calcium: 8.8 mg/dL (ref 8.6–10.2)
Chloride: 105 mmol/L (ref 96–106)
Creatinine, Ser: 0.89 mg/dL (ref 0.76–1.27)
Glucose: 99 mg/dL (ref 65–99)
Potassium: 4.7 mmol/L (ref 3.5–5.2)
Sodium: 140 mmol/L (ref 134–144)
eGFR: 91 mL/min/{1.73_m2} (ref >59)

## 2021-01-10 MED ORDER — METOPROLOL TARTRATE 50 MG PO TABS: ORAL_TABLET | ORAL | 0 refills | Status: DC

## 2021-01-10 NOTE — Patient Instructions (Signed)
Medication Instructions:  Take Metoprolol 50 mg two hours before the CT scan when scheduled.  *If you need a refill on your cardiac medications before your next appointment, please call your pharmacy*   Lab Work: BMET today  If you have labs (blood work) drawn today and your tests are completely normal, you will receive your results only by: Marland Kitchen MyChart Message (if you have MyChart) OR . A paper copy in the mail If you have any lab test that is abnormal or we need to change your treatment, we will call you to review the results.   Testing/Procedures: Echocardiogram - Your physician has requested that you have an echocardiogram. Echocardiography is a painless test that uses sound waves to create images of your heart. It provides your doctor with information about the size and shape of your heart and how well your heart's chambers and valves are working. This procedure takes approximately one hour. There are no restrictions for this procedure. This will be performed at our [facility] location - [address].  Your physician has requested that you have cardiac CT. Cardiac computed tomography (CT) is a painless test that uses an x-ray machine to take clear, detailed pictures of your heart. For further information please visit [email or web address]. Please follow instruction sheet as given.    Follow-Up: At [facility], you and your health needs are our priority.  As part of our continuing mission to provide you with exceptional heart care, we have created designated Provider Care Teams.  These Care Teams include your primary Cardiologist (physician) and Advanced Practice Providers (APPs -  Physician Assistants and Nurse Practitioners) who all work together to provide you with the care you need, when you need it.  We recommend signing up for the patient portal called "MyChart".  Sign up information is provided on this After Visit Summary.  MyChart is used to connect with patients for  Virtual Visits (Telemedicine).  Patients are able to view lab/test results, encounter notes, upcoming appointments, etc.  Non-urgent messages can be sent to your provider as well.   To learn more about what you can do with MyChart, go to [email or web address].    Your next appointment:   3 month(s)  The format for your next appointment:   In Person  Provider:   Eleonore Chiquito, MD   Other Instructions Your cardiac CT will be scheduled at one of the below locations:   [facility] [address] [place], [place] [address] [phone]   If scheduled at [facility] please arrive at the [facility] main entrance (entrance A) of [facility] 30 minutes prior to test start time. Proceed to the [facility] Radiology Department (first floor) to check-in and test prep.  Please follow these instructions carefully (unless otherwise directed):  Hold all erectile dysfunction medications at least 3 days (72 hrs) prior to test.  On the Night Before the Test: . Be sure to Drink plenty of water. . Do not consume any caffeinated/decaffeinated beverages or chocolate 12 hours prior to your test. . Do not take any antihistamines 12 hours prior to your test.  On the Day of the Test: . Drink plenty of water until 1 hour prior to the test. . Do not eat any food 4 hours prior to the test. . You may take your regular medications prior to the test.  . Take metoprolol (Lopressor) two hours prior to test. . HOLD Furosemide/Hydrochlorothiazide morning of the test. . FEMALES-  please wear underwire-free bra if available      After the Test: . Drink plenty of water. . After receiving IV contrast, you may experience a mild flushed feeling. This is normal. . On occasion, you may experience a mild rash up to 24 hours after the test. This is not dangerous. If this occurs, you can take Benadryl 25 mg and increase your fluid intake. . If you experience trouble breathing, this can  be serious. If it is severe call 911 IMMEDIATELY. If it is mild, please call our office. . If you take any of these medications: Glipizide/Metformin, Avandament, Glucavance, please do not take 48 hours after completing test unless otherwise instructed.   Once we have confirmed authorization from your insurance company, we will call you to set up a date and time for your test. Based on how quickly your insurance processes prior authorizations requests, please allow up to 4 weeks to be contacted for scheduling your Cardiac CT appointment. Be advised that routine Cardiac CT appointments could be scheduled as many as 8 weeks after your provider has ordered it.  For non-scheduling related questions, please contact the cardiac imaging nurse navigator should you have any questions/concerns: Marchia Bond, Cardiac Imaging Nurse Navigator Gordy Clement, Cardiac Imaging Nurse Navigator [facility] Direct Office Dial: [phone]   For scheduling needs, including cancellations and rescheduling, please call [place], [phone].

## 2021-01-17 ENCOUNTER — Telehealth (HOSPITAL_COMMUNITY): Payer: Self-pay | Admitting: *Deleted

## 2021-01-17 NOTE — Telephone Encounter (Signed)
Reaching out to patient to offer assistance regarding upcoming cardiac imaging study; pt verbalizes understanding of appt date/time, parking situation and where to check in, pre-test NPO status and medications ordered, and verified current allergies; name and call back number provided for further questions should they arise  Jaida Basurto RN Navigator Cardiac Imaging  Heart and Vascular [phone] office [phone] cell  

## 2021-01-19 ENCOUNTER — Encounter (HOSPITAL_COMMUNITY): Payer: Self-pay

## 2021-01-19 ENCOUNTER — Other Ambulatory Visit: Payer: Self-pay

## 2021-01-19 ENCOUNTER — Ambulatory Visit (HOSPITAL_COMMUNITY)
Admission: RE | Admit: 2021-01-19 | Discharge: 2021-01-19 | Disposition: A | Discharge status: Home / Self Care | Payer: Medicare Other | Source: Ambulatory Visit | Attending: Cardiovascular Disease | Admitting: Cardiovascular Disease

## 2021-01-19 DIAGNOSIS — R079 Chest pain, unspecified: Secondary | ICD-10-CM | POA: Insufficient documentation

## 2021-01-19 DIAGNOSIS — R931 Abnormal findings on diagnostic imaging of heart and coronary circulation: Secondary | ICD-10-CM | POA: Diagnosis not present

## 2021-01-19 DIAGNOSIS — R9431 Abnormal electrocardiogram [ECG] [EKG]: Secondary | ICD-10-CM | POA: Insufficient documentation

## 2021-01-19 DIAGNOSIS — I2510 Atherosclerotic heart disease of native coronary artery without angina pectoris: Secondary | ICD-10-CM | POA: Diagnosis not present

## 2021-01-19 DIAGNOSIS — Z006 Encounter for examination for normal comparison and control in clinical research program: Secondary | ICD-10-CM

## 2021-01-19 MED ORDER — NITROGLYCERIN 0.4 MG SL SUBL
0.8000 mg | SUBLINGUAL_TABLET | Freq: Once | SUBLINGUAL | Status: AC
  Administered 2021-01-19: 0.8 mg via SUBLINGUAL

## 2021-01-19 MED ORDER — IOHEXOL 350 MG/ML SOLN
80.0000 mL | Freq: Once | INTRAVENOUS | Status: AC | PRN
  Administered 2021-01-19: 80 mL via INTRAVENOUS

## 2021-01-19 MED ORDER — NITROGLYCERIN 0.4 MG SL SUBL
SUBLINGUAL_TABLET | SUBLINGUAL | Status: AC
  Filled 2021-01-19: qty 2

## 2021-01-19 NOTE — Research (Signed)
IDENTIFY Informed Consent                  Subject Name: Darren Nixon    Subject met inclusion and exclusion criteria.  The informed consent form, study requirements and expectations were reviewed with the subject and questions and concerns were addressed prior to the signing of the consent form.  The subject verbalized understanding of the trial requirements.  The subject agreed to participate in the IDENTIFY trial and signed the informed consent at 09:19AM on 01/19/21.  The informed consent was obtained prior to performance of any protocol-specific procedures for the subject.  A copy of the signed informed consent was given to the subject and a copy was placed in the subject's medical record.   Meade Maw, Naval architect

## 2021-01-23 DIAGNOSIS — I2510 Atherosclerotic heart disease of native coronary artery without angina pectoris: Secondary | ICD-10-CM | POA: Diagnosis not present

## 2021-01-24 DIAGNOSIS — E669 Obesity, unspecified: Secondary | ICD-10-CM | POA: Diagnosis not present

## 2021-01-24 DIAGNOSIS — B001 Herpesviral vesicular dermatitis: Secondary | ICD-10-CM | POA: Diagnosis not present

## 2021-01-24 DIAGNOSIS — Z0000 Encounter for general adult medical examination without abnormal findings: Secondary | ICD-10-CM | POA: Diagnosis not present

## 2021-01-24 DIAGNOSIS — M150 Primary generalized (osteo)arthritis: Secondary | ICD-10-CM | POA: Diagnosis not present

## 2021-01-24 DIAGNOSIS — F419 Anxiety disorder, unspecified: Secondary | ICD-10-CM | POA: Diagnosis not present

## 2021-01-24 DIAGNOSIS — E782 Mixed hyperlipidemia: Secondary | ICD-10-CM | POA: Diagnosis not present

## 2021-01-24 DIAGNOSIS — M720 Palmar fascial fibromatosis [Dupuytren]: Secondary | ICD-10-CM | POA: Diagnosis not present

## 2021-01-28 ENCOUNTER — Other Ambulatory Visit: Payer: Self-pay

## 2021-01-28 MED ORDER — METOPROLOL TARTRATE 25 MG PO TABS: 25.0000 mg | ORAL_TABLET | Freq: Two times a day (BID) | ORAL | 3 refills | Status: DC

## 2021-01-28 MED ORDER — ATORVASTATIN CALCIUM 80 MG PO TABS: 80.0000 mg | ORAL_TABLET | Freq: Every day | ORAL | 1 refills | Status: DC

## 2021-01-28 NOTE — Progress Notes (Signed)
My chart. Increase lipitor to 80 mg daily. Call in metoprolol tatrate 25 mg BID.   Lake Bells T. Audie Box, MD, [place]  [address] [place], [place] [address] [phone]  6:49 AM

## 2021-02-07 ENCOUNTER — Other Ambulatory Visit: Payer: Self-pay

## 2021-02-07 ENCOUNTER — Ambulatory Visit (HOSPITAL_COMMUNITY): Payer: Medicare Other | Attending: Internal Medicine

## 2021-02-07 DIAGNOSIS — R011 Cardiac murmur, unspecified: Secondary | ICD-10-CM | POA: Insufficient documentation

## 2021-02-07 LAB — ECHOCARDIOGRAM COMPLETE
Area-P 1/2: 3.77 cm2
S' Lateral: 2.8 cm

## 2021-04-10 NOTE — Progress Notes (Signed)
Cardiology Office Note:   Date:  04/12/2021  NAME:  [name]    MRN: [id number] DOB:  Mar 13, 1946   PCP:  Alroy Dust, L.Marlou Sa, MD  Cardiologist:  None  Electrophysiologist:  None   Referring MD: Alroy Dust, Carlean Jews.Marlou Sa, MD   Chief Complaint  Patient presents with  . Follow-up    3 months.  . Headache    In the mornings at times.   History of Present Illness:   Darren Nixon is a 75 y.o. male with a hx of HLD who presents for follow-up. Seen for atypical chest pain. Found to have mild to moderate CAD. CT FFR borderline for RCA lesions. Started on high intensity statin.  He reports he is doing well since her last visit.  No chest pain or trouble breathing.  He has lost roughly 15 pound since her last visit.  He is motivated about his diet.  He has increased his Lipitor.  No issues with this.  He is on metoprolol 25 twice a day.  He does report some dizziness lightheadedness in the mornings.  I suspect this is related to the metoprolol.  I recommended adequate hydration.  If this continues we can stop.  We discussed the results of his coronary CTA.  He has no dangerous blockages.  This can be managed medically.  He is okay to do this.  Problem list 1.  Hyperlipidemia -Total cholesterol 225, HDL 69, LDL 137, triglycerides 106 2. CAD -Calcium score 39 (27th percentile) -mid LAD 25-49% (CT FFR 0.83) -mid RCA 40-50% (CT FFR 0.78) 3. Aortic valve sclerosis   Past Medical History: Past Medical History:  Diagnosis Date  . Hyperlipidemia     Past Surgical History: History reviewed. No pertinent surgical history.  Current Medications: Current Meds  Medication Sig  . ALPRAZolam (XANAX) 0.25 MG tablet Take 0.25 mg by mouth at bedtime as needed for anxiety.  Marland Kitchen aspirin EC 81 MG tablet Take 81 mg by mouth daily. Swallow whole.  Marland Kitchen atorvastatin (LIPITOR) 80 MG tablet Take 1 tablet (80 mg total) by mouth daily.  . famciclovir (FAMVIR) 500 MG tablet Take 500 mg by mouth 2 (two) times daily.  Take 2 tablets by mouth twice daily for fever blister as needed.  . fluticasone (FLONASE) 50 MCG/ACT nasal spray Place into both nostrils daily.  . meloxicam (MOBIC) 15 MG tablet Take 15 mg by mouth daily.  . metoprolol tartrate (LOPRESSOR) 25 MG tablet Take 1 tablet (25 mg total) by mouth 2 (two) times daily.  . Omega-3 Fatty Acids (FISH OIL) 1000 MG CAPS Take by mouth.  . Probiotic Product (PROBIOTIC ADVANCED PO) Take by mouth.  . tadalafil (CIALIS) 5 MG tablet Take 5 mg by mouth daily as needed for erectile dysfunction.     Allergies:    Patient has no active allergies.   Social History: Social History   Socioeconomic History  . Marital status: Married    Spouse name: Not on file  . Number of children: 3  . Years of education: Not on file  . Highest education level: Not on file  Occupational History  . Occupation: Games developer  Tobacco Use  . Smoking status: Former Smoker    Packs/day: 1.00    Years: 20.00    Pack years: 20.00  . Smokeless tobacco: Never Used  Substance and Sexual Activity  . Alcohol use: Yes  . Drug use: Never  . Sexual activity: Not on file  Other Topics Concern  . Not on file  Social History Narrative  . Not on file   Social Determinants of Health   Financial Resource Strain: Not on file  Food Insecurity: Not on file  Transportation Needs: Not on file  Physical Activity: Not on file  Stress: Not on file  Social Connections: Not on file     Family History: The patient's family history includes Heart attack in his father; Heart failure in his father; Kidney disease in his brother.  ROS:   All other ROS reviewed and negative. Pertinent positives noted in the HPI.     EKGs/Labs/Other Studies Reviewed:   The following studies were personally reviewed by me today:  TTE 02/07/2021 1. Left ventricular ejection fraction, by estimation, is 60 to 65%. The  left ventricle has normal function. The left ventricle has no regional  wall motion  abnormalities. Left ventricular diastolic parameters were  normal.  2. Right ventricular systolic function is normal. The right ventricular  size is normal.  3. The mitral valve is normal in structure. Mild mitral valve  regurgitation.  4. The aortic valve is tricuspid. Aortic valve regurgitation is not  visualized. Mild to moderate aortic valve sclerosis/calcification is  present, without any evidence of aortic stenosis.  5. The inferior vena cava is dilated in size with >50% respiratory  variability, suggesting right atrial pressure of 8 mmHg.   CCTA 01/19/2021  IMPRESSION: 1. Coronary calcium score of 39. This was 27th percentile for age and sex matched controls.  2. Normal coronary origin with right dominance.  3. Mild CAD (25-49%) in the mid LAD.  4. Minimal CAD (<25%) in the prox LCX.  5. Mild to moderate CAD in the mid RCA (40-50%).  CT FFR   1. Left Main: 0.98; no significant stenosis. 2. Proximal LAD: 0.94; no significant stenosis. 3. Mid LAD: 0.91; no significant stenosis. 4. Distal LAD: 0.82->0.74; borderline. 5. LCX: 0.95; no significant stenosis. 6. Proximal RCA: 0.96; no significant stenosis. 7. Mid RCA: 0.78; borderline significant stenosis. 8. Distal RCA: 0.74; significant stenosis.  Recent Labs: 01/10/2021: BUN 19; Creatinine, Ser 0.89; Potassium 4.7; Sodium 140   Recent Lipid Panel No results found for: CHOL, TRIG, HDL, CHOLHDL, VLDL, LDLCALC, LDLDIRECT  Physical Exam:   VS:  BP (!) 144/70 (BP Location: Left Arm, Patient Position: Sitting, Cuff Size: Normal)   Pulse 62   Ht 6\' 1"  (1.854 m)   Wt 215 lb (97.5 kg)   BMI 28.37 kg/m    Wt Readings from Last 3 Encounters:  04/12/21 215 lb (97.5 kg)  01/10/21 233 lb (105.7 kg)    General: Well nourished, well developed, in no acute distress Head: Atraumatic, normal size  Eyes: PEERLA, EOMI  Neck: Supple, no JVD Endocrine: No thryomegaly Cardiac: Normal S1, S2; RRR; no murmurs, rubs, or  gallops Lungs: Clear to auscultation bilaterally, no wheezing, rhonchi or rales  Abd: Soft, nontender, no hepatomegaly  Ext: No edema, pulses 2+ Musculoskeletal: No deformities, BUE and BLE strength normal and equal Skin: Warm and dry, no rashes   Neuro: Alert and oriented to person, place, time, and situation, CNII-XII grossly intact, no focal deficits  Psych: Normal mood and affect   ASSESSMENT:   Darren Nixon is a 75 y.o. male who presents for the following: 1. Coronary artery disease involving native coronary artery of native heart without angina pectoris   2. Mixed hyperlipidemia     PLAN:   1. Coronary artery disease involving native coronary artery of native heart without angina pectoris 2. Mixed  hyperlipidemia -Coronary CTA with mild disease in the mid LAD that is negative by CT FFR.  The distal CT FFR is just related to tapering. -He has a borderline mid RCA lesion.  This can be treated medically.  He has no dangerous blockages. -No symptoms concerning for angina.  I recommended regular exercise and continue weight loss.  BP slightly elevated but he reports whitecoat hypertension. -He will continue aspirin 81 mg daily.  He is on Lipitor 80 mg daily.  We will get his LDL cholesterol less than 50.  Recheck today.  He is fasting. -He will continue with metoprolol tartrate 25 twice daily.  He does report some dizziness and lightheadedness.  I recommended regular hydration.  If symptoms continue he can stop this. -We will see him back in 4 months to reassess symptoms. -Echocardiogram shows normal LV function.  Overall this can be managed medically.  Disposition: Return in about 4 months (around 08/12/2021).  Medication Adjustments/Labs and Tests Ordered: Current medicines are reviewed at length with the patient today.  Concerns regarding medicines are outlined above.  Orders Placed This Encounter  Procedures  . Lipid panel   No orders of the defined types were placed in this  encounter.   Patient Instructions  Medication Instructions:  The current medical regimen is effective;  continue present plan and medications.  *If you need a refill on your cardiac medications before your next appointment, please call your pharmacy*   Lab Work: LIPID today   If you have labs (blood work) drawn today and your tests are completely normal, you will receive your results only by: Marland Kitchen MyChart Message (if you have MyChart) OR . A paper copy in the mail If you have any lab test that is abnormal or we need to change your treatment, we will call you to review the results.   Follow-Up: At [facility], you and your health needs are our priority.  As part of our continuing mission to provide you with exceptional heart care, we have created designated Provider Care Teams.  These Care Teams include your primary Cardiologist (physician) and Advanced Practice Providers (APPs -  Physician Assistants and Nurse Practitioners) who all work together to provide you with the care you need, when you need it.  We recommend signing up for the patient portal called "MyChart".  Sign up information is provided on this After Visit Summary.  MyChart is used to connect with patients for Virtual Visits (Telemedicine).  Patients are able to view lab/test results, encounter notes, upcoming appointments, etc.  Non-urgent messages can be sent to your provider as well.   To learn more about what you can do with MyChart, go to [email or web address].    Your next appointment:   4 month(s)  The format for your next appointment:   In Person  Provider:   Eleonore Chiquito, MD       Time Spent with Patient: I have spent a total of 35 minutes with patient reviewing hospital notes, telemetry, EKGs, labs and examining the patient as well as establishing an assessment and plan that was discussed with the patient.  > 50% of time was spent in direct patient care.  Signed, Addison Naegeli. Audie Box, MD, [place]  [address], [place], [place] [address] [phone]  04/12/2021 9:09 AM

## 2021-04-12 ENCOUNTER — Ambulatory Visit (INDEPENDENT_AMBULATORY_CARE_PROVIDER_SITE_OTHER): Payer: Medicare Other | Admitting: Cardiovascular Disease

## 2021-04-12 ENCOUNTER — Other Ambulatory Visit: Payer: Self-pay

## 2021-04-12 ENCOUNTER — Encounter: Payer: Self-pay | Admitting: Cardiovascular Disease

## 2021-04-12 VITALS — BP 144/70 | HR 62 | Ht 73.0 in | Wt 215.0 lb

## 2021-04-12 DIAGNOSIS — E782 Mixed hyperlipidemia: Secondary | ICD-10-CM

## 2021-04-12 DIAGNOSIS — I2510 Atherosclerotic heart disease of native coronary artery without angina pectoris: Secondary | ICD-10-CM | POA: Diagnosis not present

## 2021-04-12 LAB — LIPID PANEL
Chol/HDL Ratio: 2.5 ratio (ref 0.0–5.0)
Cholesterol, Total: 150 mg/dL (ref 100–199)
HDL: 59 mg/dL (ref >39)
LDL Chol Calc (NIH): 72 mg/dL (ref 0–99)
Triglycerides: 105 mg/dL (ref 0–149)
VLDL Cholesterol Cal: 19 mg/dL (ref 5–40)

## 2021-04-12 NOTE — Patient Instructions (Signed)
Medication Instructions:  The current medical regimen is effective;  continue present plan and medications.  *If you need a refill on your cardiac medications before your next appointment, please call your pharmacy*   Lab Work: LIPID today   If you have labs (blood work) drawn today and your tests are completely normal, you will receive your results only by: Marland Kitchen MyChart Message (if you have MyChart) OR . A paper copy in the mail If you have any lab test that is abnormal or we need to change your treatment, we will call you to review the results.   Follow-Up: At [facility], you and your health needs are our priority.  As part of our continuing mission to provide you with exceptional heart care, we have created designated Provider Care Teams.  These Care Teams include your primary Cardiologist (physician) and Advanced Practice Providers (APPs -  Physician Assistants and Nurse Practitioners) who all work together to provide you with the care you need, when you need it.  We recommend signing up for the patient portal called "MyChart".  Sign up information is provided on this After Visit Summary.  MyChart is used to connect with patients for Virtual Visits (Telemedicine).  Patients are able to view lab/test results, encounter notes, upcoming appointments, etc.  Non-urgent messages can be sent to your provider as well.   To learn more about what you can do with MyChart, go to [email or web address].    Your next appointment:   4 month(s)  The format for your next appointment:   In Person  Provider:   Eleonore Chiquito, MD

## 2021-04-13 ENCOUNTER — Other Ambulatory Visit: Payer: Self-pay

## 2021-04-13 MED ORDER — EZETIMIBE 10 MG PO TABS: 10.0000 mg | ORAL_TABLET | Freq: Every day | ORAL | 3 refills | Status: DC

## 2021-04-26 ENCOUNTER — Telehealth: Payer: Self-pay

## 2021-04-26 DIAGNOSIS — Z006 Encounter for examination for normal comparison and control in clinical research program: Secondary | ICD-10-CM

## 2021-04-26 NOTE — Telephone Encounter (Signed)
Called patient for 90 day Identify phone call pt stated he is not having anymore cardiac symptoms but did follow up with PCP, I reminded the patient that we would be calling him back around March for a year follow up phone call.

## 2021-06-01 DIAGNOSIS — H0288A Meibomian gland dysfunction right eye, upper and lower eyelids: Secondary | ICD-10-CM | POA: Diagnosis not present

## 2021-06-01 DIAGNOSIS — H43813 Vitreous degeneration, bilateral: Secondary | ICD-10-CM | POA: Diagnosis not present

## 2021-06-01 DIAGNOSIS — H353131 Nonexudative age-related macular degeneration, bilateral, early dry stage: Secondary | ICD-10-CM | POA: Diagnosis not present

## 2021-06-01 DIAGNOSIS — Z961 Presence of intraocular lens: Secondary | ICD-10-CM | POA: Diagnosis not present

## 2021-06-01 DIAGNOSIS — H35372 Puckering of macula, left eye: Secondary | ICD-10-CM | POA: Diagnosis not present

## 2021-07-24 ENCOUNTER — Other Ambulatory Visit: Payer: Self-pay | Admitting: Cardiovascular Disease

## 2021-08-14 NOTE — Progress Notes (Signed)
Cardiology Office Note:   Date:  08/16/2021  NAME:  Darren Nixon    MRN: [id number] DOB:  10/01/1946   PCP:  Alroy Dust, L.Marlou Sa, MD  Cardiologist:  None  Electrophysiologist:  None   Referring MD: Alroy Dust, Carlean Jews.Marlou Sa, MD   Chief Complaint  Patient presents with   Coronary Artery Disease         History of Present Illness:   Darren Nixon is a 75 y.o. male with a hx of non-obstructive CAD, HLD who presents for follow-up. LDL was not at goal. Lionel December was added.  He reports he is doing well.  No chest pain or trouble breathing.  He is not exercising but has no limitations.  He did switch jobs.  He is now working as a Comptroller at [employer].  He reports it is a bit lower stress for him.  He enjoys this work.  Blood pressure 130/72.  Again he is not exercising but has no limitations.  He is lost 20 to 30 pounds in the last 6 months.  He is working on a low carbohydrate diet.  I have encouraged him to continue this.  LDL cholesterol was 72 at her last visit.  We started Zetia.  No issues on this medicine.  He will have follow-up lab work in 6 months with his primary care physician.  Problem list 1.  Hyperlipidemia -Total cholesterol 150, HDL 59, LDL 72, triglycerides 105 2. CAD -Calcium score 39 (27th percentile) -mid LAD 25-49% (CT FFR 0.83) -mid RCA 40-50% (CT FFR 0.78) 3. Aortic valve sclerosis   Past Medical History: Past Medical History:  Diagnosis Date   Coronary artery disease    Hyperlipidemia     Past Surgical History: History reviewed. No pertinent surgical history.  Current Medications: Current Meds  Medication Sig   ALPRAZolam (XANAX) 0.25 MG tablet Take 0.25 mg by mouth at bedtime as needed for anxiety.   aspirin EC 81 MG tablet Take 81 mg by mouth daily. Swallow whole.   atorvastatin (LIPITOR) 80 MG tablet Take 1 tablet by mouth once daily   famciclovir (FAMVIR) 500 MG tablet Take 500 mg by mouth 2 (two) times  daily. Take 2 tablets by mouth twice daily for fever blister as needed.   fluticasone (FLONASE) 50 MCG/ACT nasal spray Place into both nostrils daily.   meloxicam (MOBIC) 15 MG tablet Take 15 mg by mouth daily.   Omega-3 Fatty Acids (FISH OIL) 1000 MG CAPS Take by mouth.   Probiotic Product (PROBIOTIC ADVANCED PO) Take by mouth.   tadalafil (CIALIS) 5 MG tablet Take 5 mg by mouth daily as needed for erectile dysfunction.     Allergies:    Patient has no active allergies.   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  Tobacco Use   Smoking status: Former    Packs/day: 1.00    Years: 20.00    Pack years: 20.00    Types: Cigarettes   Smokeless tobacco: Never  Substance and Sexual Activity   Alcohol use: Yes   Drug use: Never   Sexual activity: Not on file  Other Topics Concern   Not on file  Social History Narrative  Not on file   Social Determinants of Health   Financial Resource Strain: Not on file  Food Insecurity: Not on file  Transportation Needs: Not on file  Physical Activity: Not on file  Stress: Not on file  Social Connections: Not on file     Family History: The patient's family history includes Heart attack in his father; Heart failure in his father; Kidney disease in his brother.  ROS:   All other ROS reviewed and negative. Pertinent positives noted in the HPI.     EKGs/Labs/Other Studies Reviewed:   The following studies were personally reviewed by me today:  TTE 02/07/2021  1. Left ventricular ejection fraction, by estimation, is 60 to 65%. The  left ventricle has normal function. The left ventricle has no regional  wall motion abnormalities. Left ventricular diastolic parameters were   normal.   2. Right ventricular systolic function is normal. The right ventricular  size is normal.   3. The mitral valve is normal in structure. Mild mitral valve  regurgitation.   4. The aortic valve is tricuspid. Aortic valve regurgitation is not  visualized. Mild to moderate aortic valve sclerosis/calcification is  present, without any evidence of aortic stenosis.   5. The inferior vena cava is dilated in size with >50% respiratory  variability, suggesting right atrial pressure of 8 mmHg.   CCTA 01/19/2021 IMPRESSION: 1. Coronary calcium score of 39. This was 27th percentile for age and sex matched controls.   2. Normal coronary origin with right dominance.   3. Mild CAD (25-49%) in the mid LAD.   4. Minimal CAD (<25%) in the prox LCX.   5. Mild to moderate CAD in the mid RCA (40-50%).  CT FFR 04/08/2021 1. Left Main: 0.98; no significant stenosis.   2. Proximal LAD: 0.94; no significant stenosis. 3. Mid LAD: 0.91; no significant stenosis. 4. Distal LAD: 0.82->0.74; borderline. 5. LCX: 0.95; no significant stenosis. 6. Proximal RCA: 0.96; no significant stenosis. 7. Mid RCA: 0.78; borderline significant stenosis. 8. Distal RCA: 0.74; significant stenosis.   IMPRESSION: 1. Obstructive CAD in the distal LAD (CT FFR 0.82->0.74).   2. Mid RCA is borderline positive (CT FFR 0.78).  Recent Labs: 01/10/2021: BUN 19; Creatinine, Ser 0.89; Potassium 4.7; Sodium 140   Recent Lipid Panel    Component Value Date/Time   CHOL 150 04/12/2021 0912   TRIG 105 04/12/2021 0912   HDL 59 04/12/2021 0912   CHOLHDL 2.5 04/12/2021 0912   LDLCALC 72 04/12/2021 0912    Physical Exam:   VS:  BP 130/72   Pulse (!) 52   Ht 6\' 1"  (1.854 m)   Wt 212 lb 9.6 oz (96.4 kg)   SpO2 97%   BMI 28.05 kg/m    Wt Readings from Last 3 Encounters:  08/16/21 212 lb 9.6 oz (96.4 kg)  04/12/21 215 lb (97.5 kg)  01/10/21 233 lb (105.7 kg)    General: Well nourished, well developed, in no acute  distress Head: Atraumatic, normal size  Eyes: PEERLA, EOMI  Neck: Supple, no JVD Endocrine: No thryomegaly Cardiac: Normal S1, S2; RRR; no murmurs, rubs, or gallops Lungs: Clear to auscultation bilaterally, no wheezing, rhonchi or rales  Abd: Soft, nontender, no hepatomegaly  Ext: No edema, pulses 2+ Musculoskeletal: No deformities, BUE and BLE strength normal and equal Skin: Warm and dry, no rashes   Neuro: Alert and oriented to person, place, time, and situation, CNII-XII grossly intact, no focal deficits  Psych: Normal mood and affect  ASSESSMENT:   WINTHROP SHANNAHAN is a 75 y.o. male who presents for the following: 1. Coronary artery disease involving native coronary artery of native heart without angina pectoris   2. Mixed hyperlipidemia     PLAN:   1. Coronary artery disease involving native coronary artery of native heart without angina pectoris 2. Mixed hyperlipidemia -Mild nonobstructive CAD on coronary CTA last year.  Denies any symptoms of angina.  LDL cholesterol was 72.  He is on Lipitor 80 mg daily as well as Zetia 10 mg daily.  He will have repeat labs in the next few months by his primary care physician.  He is close enough to goal and I suspect he will be at goal with the addition of Zetia.  He really is doing quite well.  Dieting well.  BP is well controlled.  I recommended he start to exercise a bit more.  150 minutes/week of moderate to vigorous physical activity is recommended.  He will work on this.  His symptoms of chest pain likely were stress related.  They have improved with change in job.  Overall he is doing well.  He will see [place] yearly.     Disposition: Return if symptoms worsen or fail to improve.  Medication Adjustments/Labs and Tests Ordered: Current medicines are reviewed at length with the patient today.  Concerns regarding medicines are outlined above.  No orders of the defined types were placed in this encounter.  No orders of the defined types  were placed in this encounter.   Patient Instructions  Medication Instructions:  The current medical regimen is effective;  continue present plan and medications.  *If you need a refill on your cardiac medications before your next appointment, please call your pharmacy*   Lab Work: HAVE PCP send over lab work when completed (fax number [phone])  If you have labs (blood work) drawn today and your tests are completely normal, you will receive your results only by: [place] (if you have MyChart) OR A paper copy in the mail If you have any lab test that is abnormal or we need to change your treatment, we will call you to review the results.   Follow-Up: At [facility], you and your health needs are our priority.  As part of our continuing mission to provide you with exceptional heart care, we have created designated Provider Care Teams.  These Care Teams include your primary Cardiologist (physician) and Advanced Practice Providers (APPs -  Physician Assistants and Nurse Practitioners) who all work together to provide you with the care you need, when you need it.  We recommend signing up for the patient portal called "MyChart".  Sign up information is provided on this After Visit Summary.  MyChart is used to connect with patients for Virtual Visits (Telemedicine).  Patients are able to view lab/test results, encounter notes, upcoming appointments, etc.  Non-urgent messages can be sent to your provider as well.   To learn more about what you can do with MyChart, go to [email or web address].    Your next appointment:   12 month(s)  The format for your next appointment:   In Person  Provider:   You may see Eleonore Chiquito, MD or one of the following Advanced Practice Providers on your designated Care Team:   Almyra Deforest, PA-C Sande Rives, [place]    Time Spent with Patient: I have spent a total of 25 minutes with patient reviewing hospital notes, telemetry, EKGs, labs and  examining the patient as  well as establishing an assessment and plan that was discussed with the patient.  > 50% of time was spent in direct patient care.  Signed, Addison Naegeli. Audie Box, MD, [place]  [address], [place], [place] [address] [phone]  08/16/2021 8:55 AM

## 2021-08-16 ENCOUNTER — Encounter: Payer: Self-pay | Admitting: Cardiovascular Disease

## 2021-08-16 ENCOUNTER — Other Ambulatory Visit: Payer: Self-pay

## 2021-08-16 ENCOUNTER — Ambulatory Visit (INDEPENDENT_AMBULATORY_CARE_PROVIDER_SITE_OTHER): Payer: Medicare Other | Admitting: Cardiovascular Disease

## 2021-08-16 VITALS — BP 130/72 | HR 52 | Ht 73.0 in | Wt 212.6 lb

## 2021-08-16 DIAGNOSIS — E782 Mixed hyperlipidemia: Secondary | ICD-10-CM | POA: Diagnosis not present

## 2021-08-16 DIAGNOSIS — I2510 Atherosclerotic heart disease of native coronary artery without angina pectoris: Secondary | ICD-10-CM | POA: Diagnosis not present

## 2021-08-16 NOTE — Patient Instructions (Signed)
Medication Instructions:  The current medical regimen is effective;  continue present plan and medications.  *If you need a refill on your cardiac medications before your next appointment, please call your pharmacy*   Lab Work: HAVE PCP send over lab work when completed (fax number [phone])  If you have labs (blood work) drawn today and your tests are completely normal, you will receive your results only by: [place] (if you have MyChart) OR A paper copy in the mail If you have any lab test that is abnormal or we need to change your treatment, we will call you to review the results.   Follow-Up: At [facility], you and your health needs are our priority.  As part of our continuing mission to provide you with exceptional heart care, we have created designated Provider Care Teams.  These Care Teams include your primary Cardiologist (physician) and Advanced Practice Providers (APPs -  Physician Assistants and Nurse Practitioners) who all work together to provide you with the care you need, when you need it.  We recommend signing up for the patient portal called "MyChart".  Sign up information is provided on this After Visit Summary.  MyChart is used to connect with patients for Virtual Visits (Telemedicine).  Patients are able to view lab/test results, encounter notes, upcoming appointments, etc.  Non-urgent messages can be sent to your provider as well.   To learn more about what you can do with MyChart, go to [email or web address].    Your next appointment:   12 month(s)  The format for your next appointment:   In Person  Provider:   You may see Eleonore Chiquito, MD or one of the following Advanced Practice Providers on your designated Care Team:   Almyra Deforest, PA-C Sande Rives, [place]

## 2021-10-07 DIAGNOSIS — Z23 Encounter for immunization: Secondary | ICD-10-CM | POA: Diagnosis not present

## 2021-10-22 ENCOUNTER — Other Ambulatory Visit: Payer: Self-pay | Admitting: Cardiovascular Disease

## 2021-10-24 DIAGNOSIS — L578 Other skin changes due to chronic exposure to nonionizing radiation: Secondary | ICD-10-CM | POA: Diagnosis not present

## 2021-10-24 DIAGNOSIS — D2272 Melanocytic nevi of left lower limb, including hip: Secondary | ICD-10-CM | POA: Diagnosis not present

## 2021-10-24 DIAGNOSIS — D485 Neoplasm of uncertain behavior of skin: Secondary | ICD-10-CM | POA: Diagnosis not present

## 2021-10-24 DIAGNOSIS — L814 Other melanin hyperpigmentation: Secondary | ICD-10-CM | POA: Diagnosis not present

## 2021-10-24 DIAGNOSIS — Z85828 Personal history of other malignant neoplasm of skin: Secondary | ICD-10-CM | POA: Diagnosis not present

## 2021-10-24 DIAGNOSIS — D225 Melanocytic nevi of trunk: Secondary | ICD-10-CM | POA: Diagnosis not present

## 2021-10-24 DIAGNOSIS — L570 Actinic keratosis: Secondary | ICD-10-CM | POA: Diagnosis not present

## 2021-10-24 DIAGNOSIS — L821 Other seborrheic keratosis: Secondary | ICD-10-CM | POA: Diagnosis not present

## 2021-10-24 DIAGNOSIS — Z23 Encounter for immunization: Secondary | ICD-10-CM | POA: Diagnosis not present

## 2021-10-24 DIAGNOSIS — Z8619 Personal history of other infectious and parasitic diseases: Secondary | ICD-10-CM | POA: Diagnosis not present

## 2022-02-22 DIAGNOSIS — M150 Primary generalized (osteo)arthritis: Secondary | ICD-10-CM | POA: Diagnosis not present

## 2022-02-22 DIAGNOSIS — E669 Obesity, unspecified: Secondary | ICD-10-CM | POA: Diagnosis not present

## 2022-02-22 DIAGNOSIS — E782 Mixed hyperlipidemia: Secondary | ICD-10-CM | POA: Diagnosis not present

## 2022-02-22 DIAGNOSIS — M720 Palmar fascial fibromatosis [Dupuytren]: Secondary | ICD-10-CM | POA: Diagnosis not present

## 2022-02-22 DIAGNOSIS — Z23 Encounter for immunization: Secondary | ICD-10-CM | POA: Diagnosis not present

## 2022-02-22 DIAGNOSIS — B001 Herpesviral vesicular dermatitis: Secondary | ICD-10-CM | POA: Diagnosis not present

## 2022-02-22 DIAGNOSIS — Z0000 Encounter for general adult medical examination without abnormal findings: Secondary | ICD-10-CM | POA: Diagnosis not present

## 2022-02-22 DIAGNOSIS — F419 Anxiety disorder, unspecified: Secondary | ICD-10-CM | POA: Diagnosis not present

## 2022-06-05 ENCOUNTER — Other Ambulatory Visit: Payer: Self-pay | Admitting: Cardiovascular Disease

## 2022-06-05 DIAGNOSIS — H43813 Vitreous degeneration, bilateral: Secondary | ICD-10-CM | POA: Diagnosis not present

## 2022-06-05 DIAGNOSIS — H35373 Puckering of macula, bilateral: Secondary | ICD-10-CM | POA: Diagnosis not present

## 2022-06-05 DIAGNOSIS — Z961 Presence of intraocular lens: Secondary | ICD-10-CM | POA: Diagnosis not present

## 2022-06-05 DIAGNOSIS — H353131 Nonexudative age-related macular degeneration, bilateral, early dry stage: Secondary | ICD-10-CM | POA: Diagnosis not present

## 2022-06-16 DIAGNOSIS — R221 Localized swelling, mass and lump, neck: Secondary | ICD-10-CM | POA: Diagnosis not present

## 2022-06-16 DIAGNOSIS — M542 Cervicalgia: Secondary | ICD-10-CM | POA: Diagnosis not present

## 2022-06-29 DIAGNOSIS — Z09 Encounter for follow-up examination after completed treatment for conditions other than malignant neoplasm: Secondary | ICD-10-CM | POA: Diagnosis not present

## 2022-06-29 DIAGNOSIS — K5730 Diverticulosis of large intestine without perforation or abscess without bleeding: Secondary | ICD-10-CM | POA: Diagnosis not present

## 2022-06-29 DIAGNOSIS — K649 Unspecified hemorrhoids: Secondary | ICD-10-CM | POA: Diagnosis not present

## 2022-06-29 DIAGNOSIS — D125 Benign neoplasm of sigmoid colon: Secondary | ICD-10-CM | POA: Diagnosis not present

## 2022-06-29 DIAGNOSIS — Z86010 Personal history of colonic polyps: Secondary | ICD-10-CM | POA: Diagnosis not present

## 2022-07-03 DIAGNOSIS — D125 Benign neoplasm of sigmoid colon: Secondary | ICD-10-CM | POA: Diagnosis not present

## 2022-07-07 ENCOUNTER — Ambulatory Visit: Payer: Medicare Other | Admitting: Physical Therapy

## 2022-08-22 DIAGNOSIS — Z23 Encounter for immunization: Secondary | ICD-10-CM | POA: Diagnosis not present

## 2022-10-01 IMAGING — CR DG CHEST 2V
2 series · 2 of 2 positions shown · non-contrast
Comparison: None.

CLINICAL DATA: Chest pain for 2 weeks.

EXAM:
CHEST - 2 VIEW

[w chest pa]
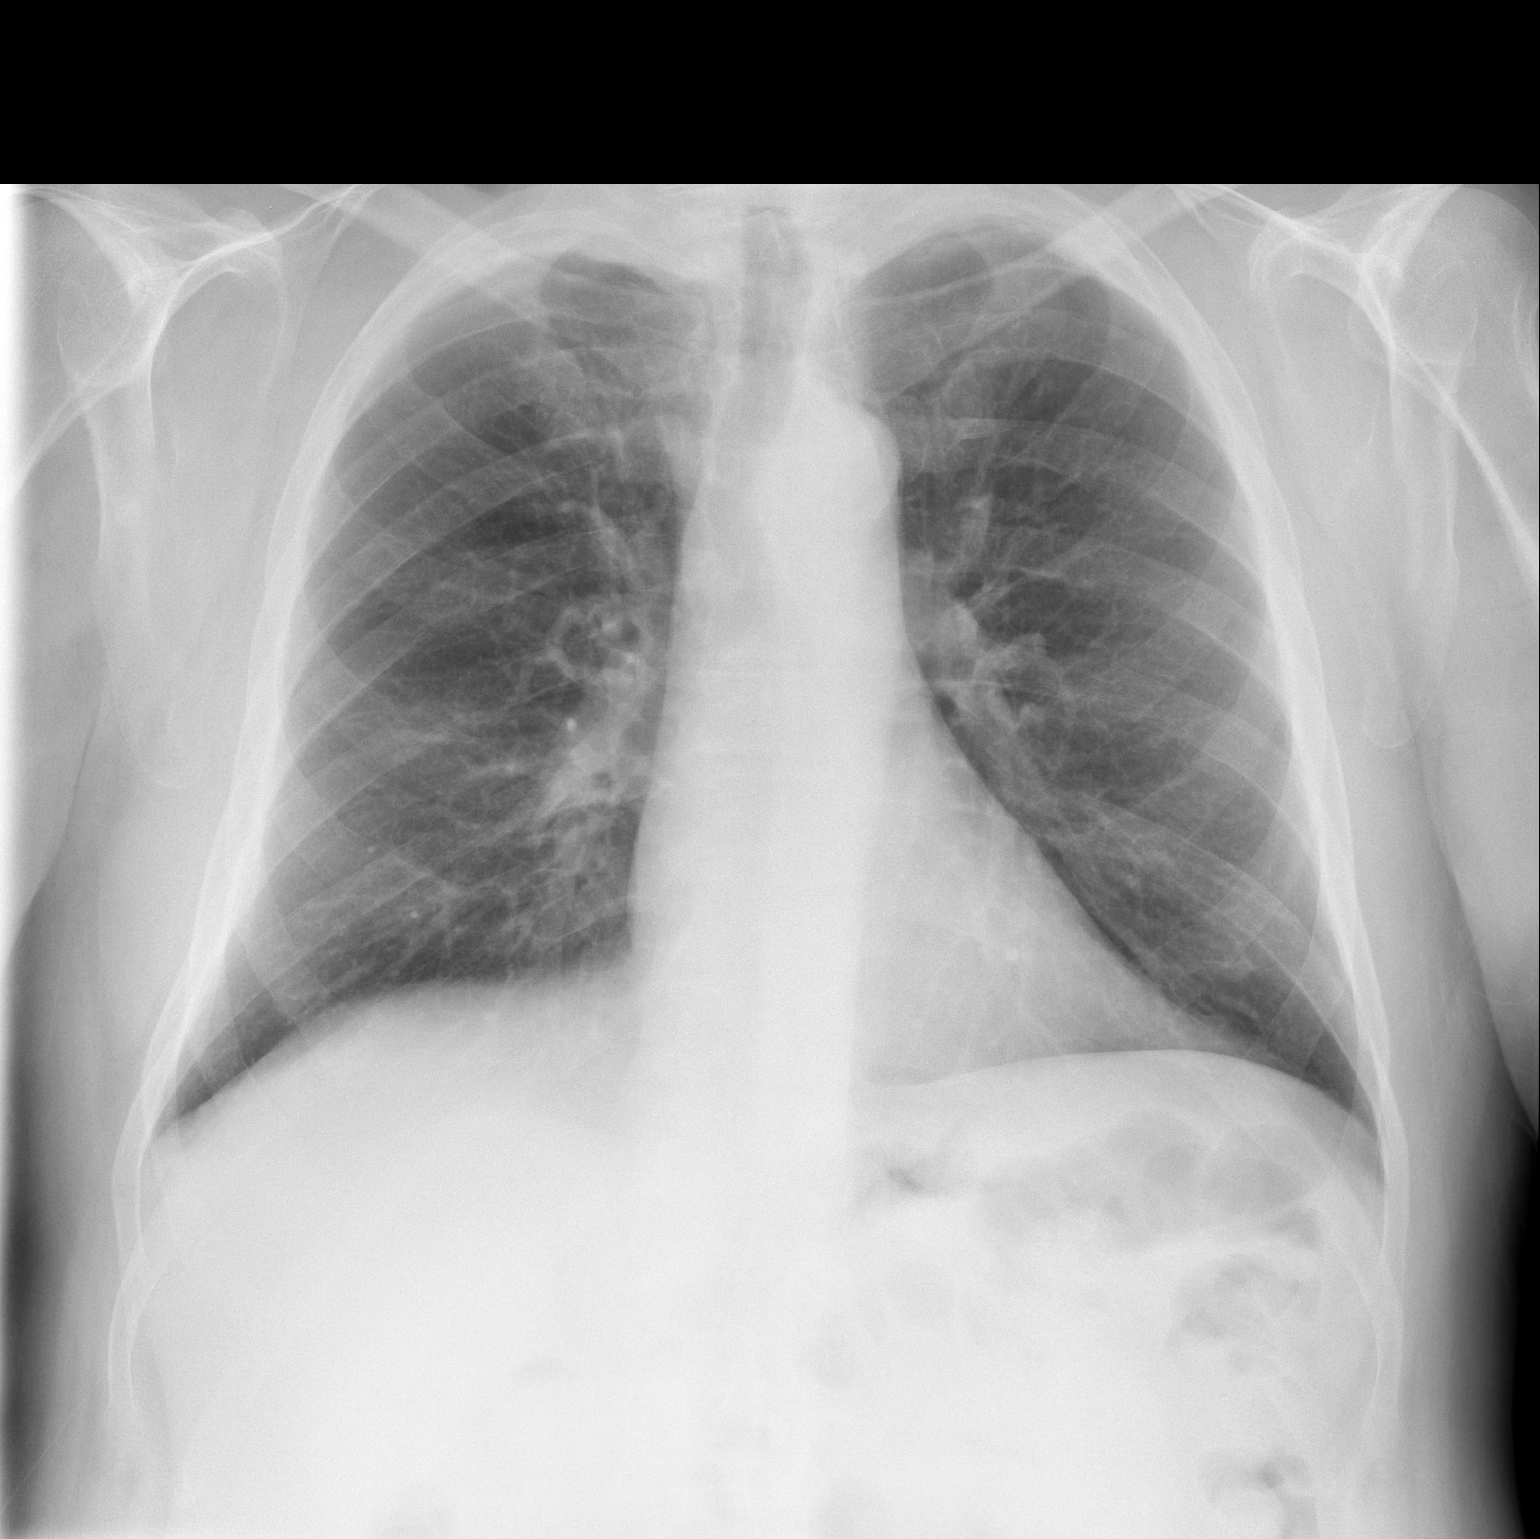

[w chest lat]
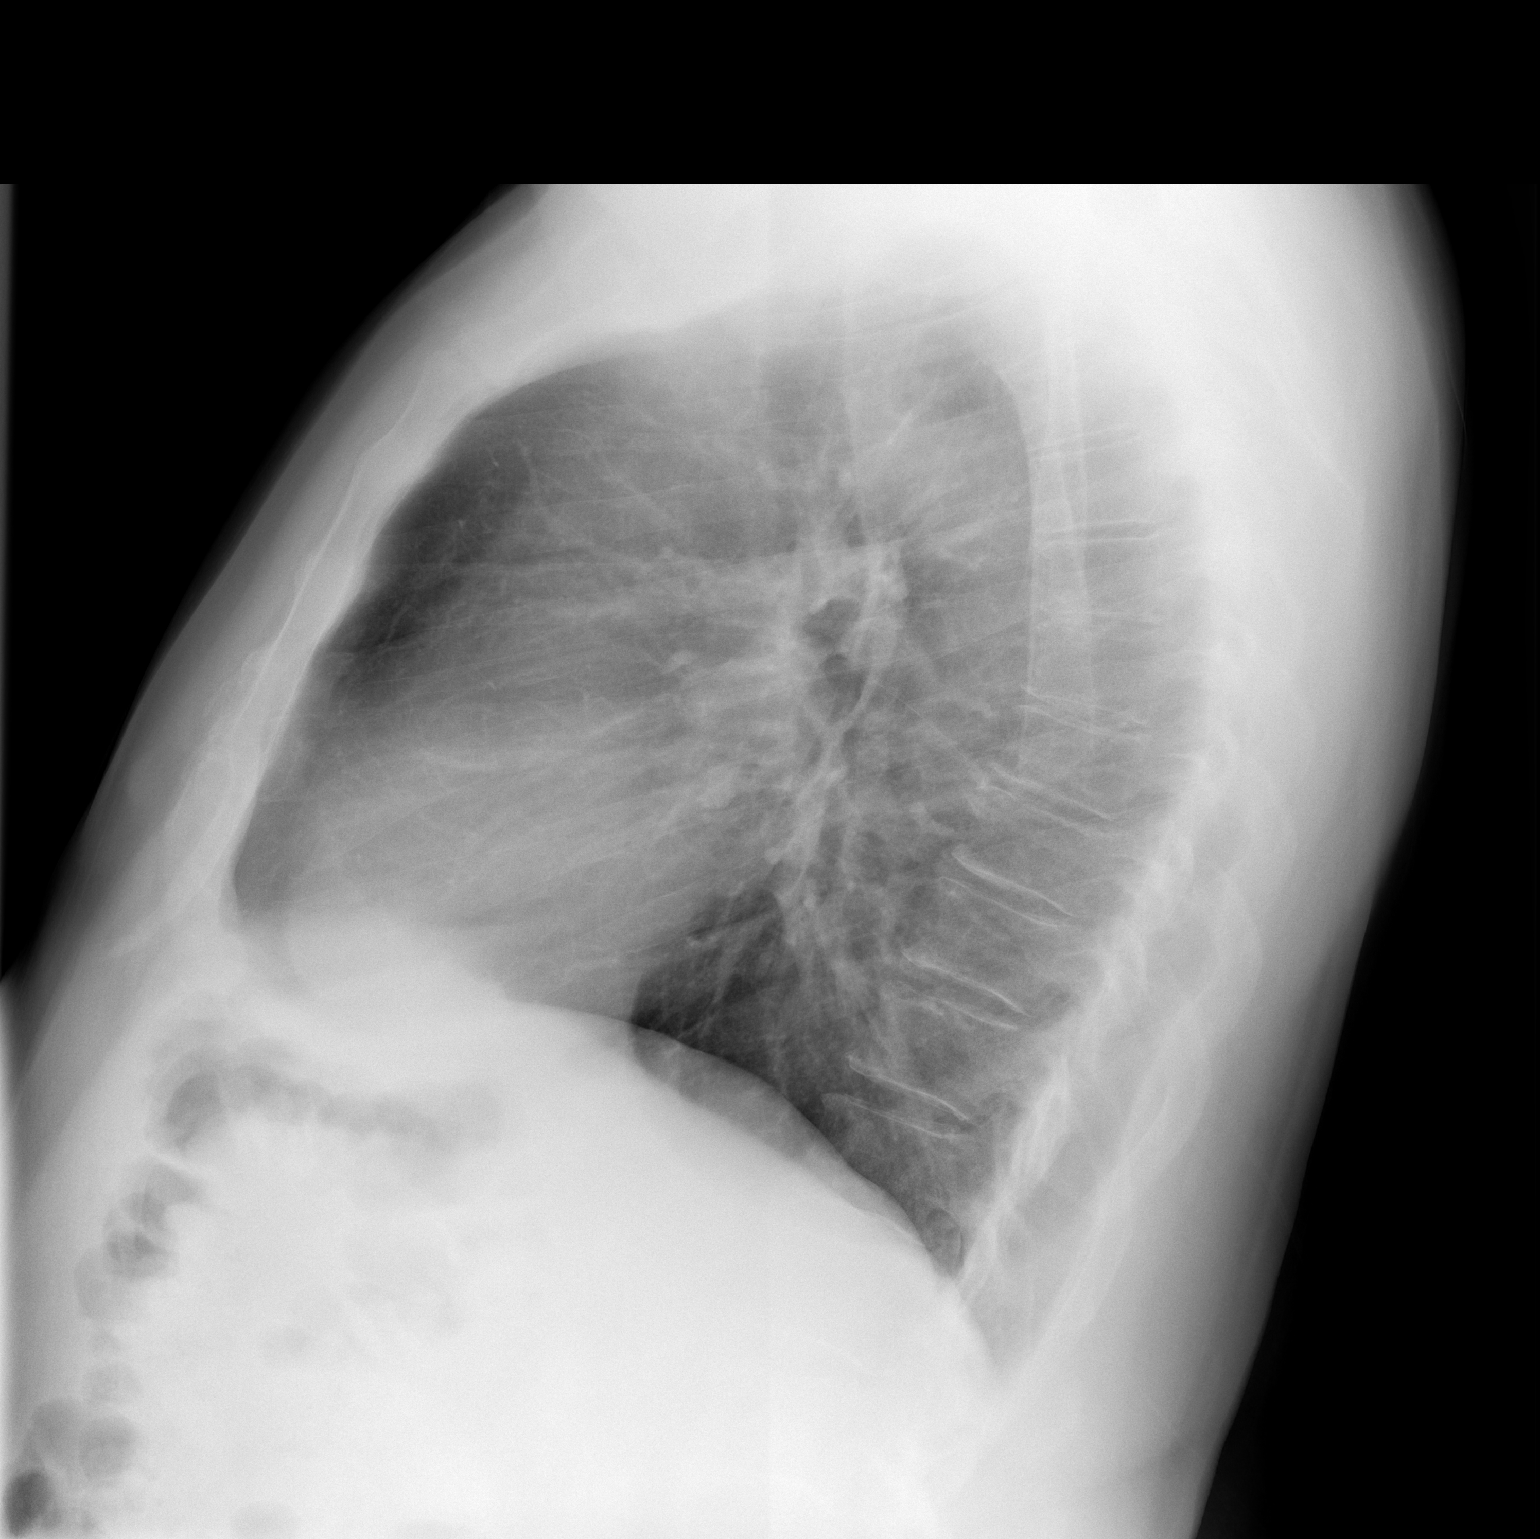

[2 of 2 positions shown; findings below may reference images not displayed]

FINDINGS: Midline trachea. Normal heart size and mediastinal contours.
Atherosclerosis in the transverse aorta.

Sharp costophrenic angles. No pneumothorax. Clear lungs. Mild right
hemidiaphragm elevation. Mild biapical pleural thickening.
IMPRESSION: No acute cardiopulmonary disease.

Aortic Atherosclerosis ([id number]).

## 2022-10-19 DIAGNOSIS — L821 Other seborrheic keratosis: Secondary | ICD-10-CM | POA: Diagnosis not present

## 2022-10-19 DIAGNOSIS — L570 Actinic keratosis: Secondary | ICD-10-CM | POA: Diagnosis not present

## 2022-10-19 DIAGNOSIS — L814 Other melanin hyperpigmentation: Secondary | ICD-10-CM | POA: Diagnosis not present

## 2022-10-19 DIAGNOSIS — D225 Melanocytic nevi of trunk: Secondary | ICD-10-CM | POA: Diagnosis not present

## 2022-10-19 DIAGNOSIS — L578 Other skin changes due to chronic exposure to nonionizing radiation: Secondary | ICD-10-CM | POA: Diagnosis not present

## 2022-10-19 DIAGNOSIS — Z85828 Personal history of other malignant neoplasm of skin: Secondary | ICD-10-CM | POA: Diagnosis not present

## 2022-10-29 IMAGING — CT CT HEART MORP W/ CTA COR W/ SCORE W/ CA W/CM &/OR W/O CM
1 series · 12 of 20 positions shown, 15 images · non-contrast
Comparison: None.
COMPARISON: None.

Addendum:
EXAM:
OVER-READ INTERPRETATION  CT CHEST

The following report is an over-read performed by radiologist Dr.
Nabii Isaya Kiriga [REDACTED] on 01/19/2021. This over-read
does not include interpretation of cardiac or coronary anatomy or
pathology. The coronary CTA interpretation by the cardiologist is
attached.
CLINICAL DATA: Chest pain
Cardiac/Coronary CTA
TECHNIQUE: The patient was scanned on a Phillips Force scanner. A 100 kV
prospective scan was triggered in the descending thoracic aorta at
111 HU's. Axial non-contrast 3 mm slices were carried out through
the heart. The data set was analyzed on a dedicated work station and
scored using the Agatson method. Gantry rotation speed was 250 msecs
and collimation was .6 mm. No beta blockade and 0.8 mg of sl NTG was
given. The 3D data set was reconstructed in 5% intervals of the
35-75 % of the R-R cycle. Diastolic phases were analyzed on a
dedicated work station using MPR, MIP and VRT modes. The patient
received 80 cc of contrast.

[Series 322: findings · 12 of 22 slices shown, 15 images]
[im 2/22  vessel]
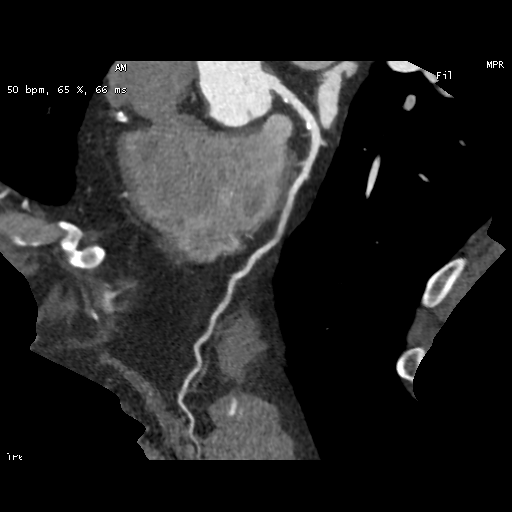
[im 2/22  lung]
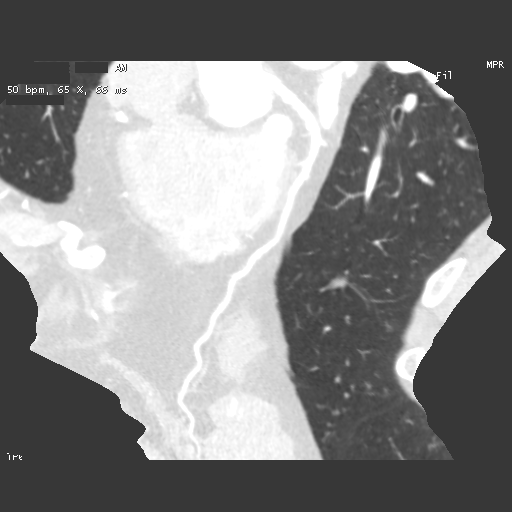
[im 4/22  vessel]
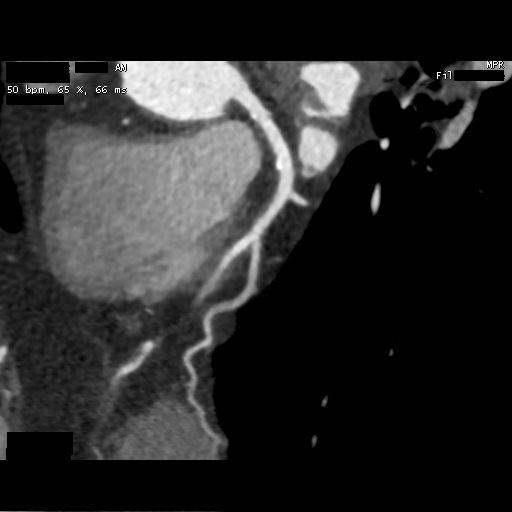
[im 5/22  vessel]
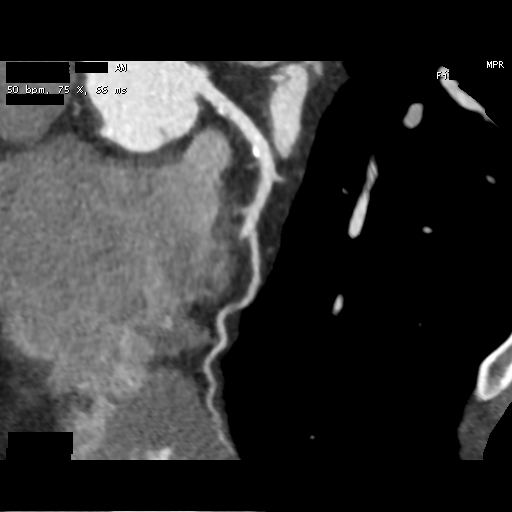
[im 7/22  vessel]
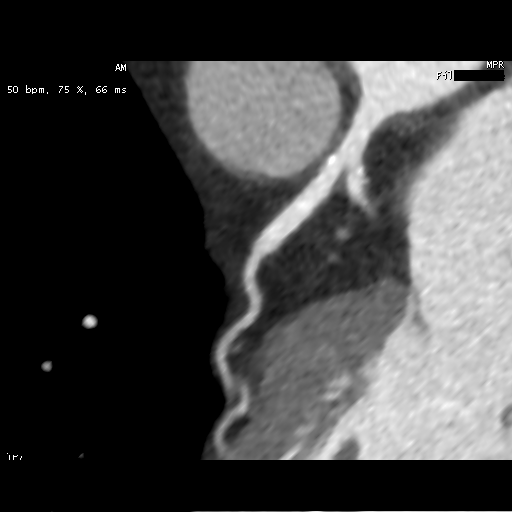
[im 8/22  vessel]
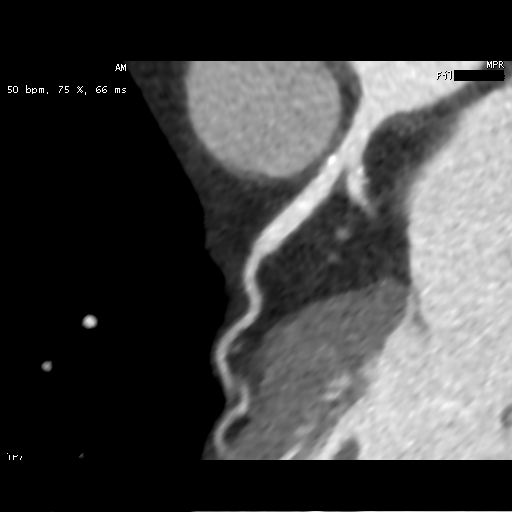
[im 8/22  lung]
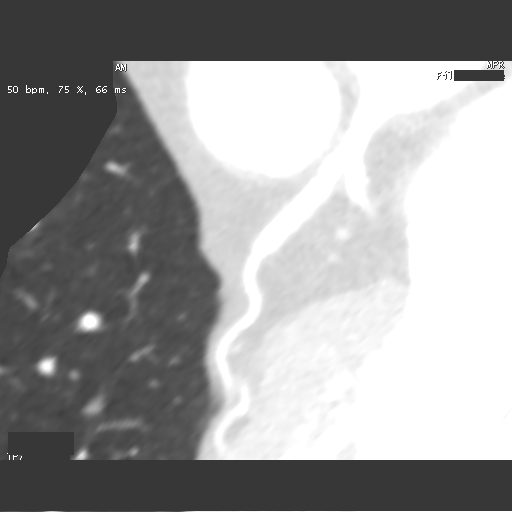
[im 10/22  vessel]
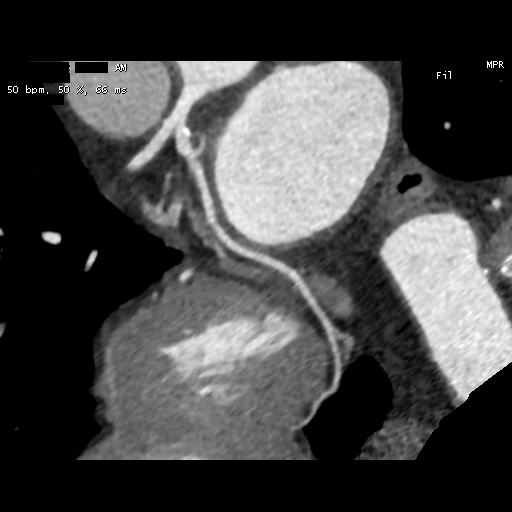
[im 12/22  vessel]
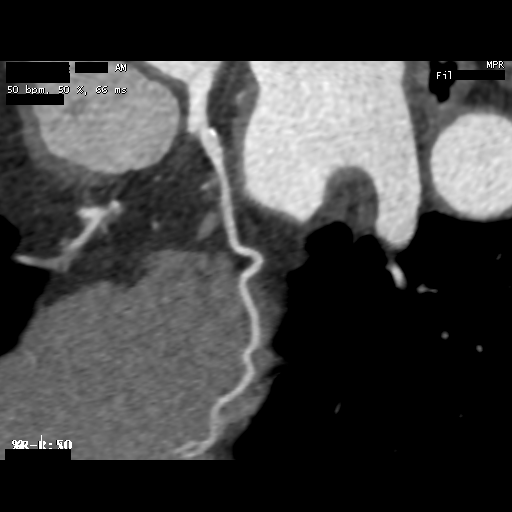
[im 14/22  vessel]
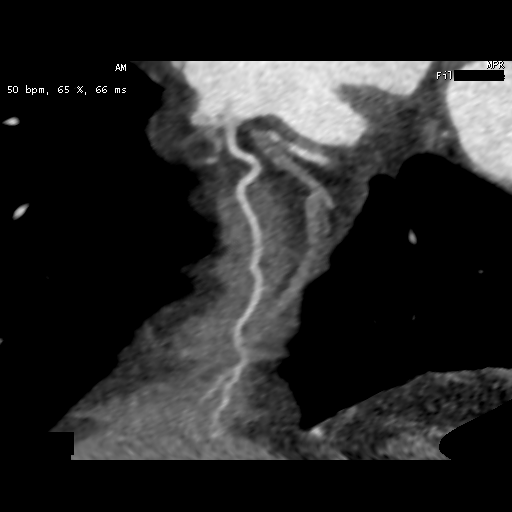
[im 15/22  vessel]
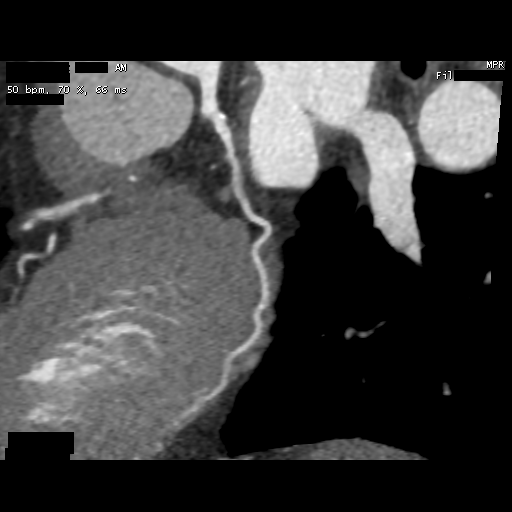
[im 15/22  lung]
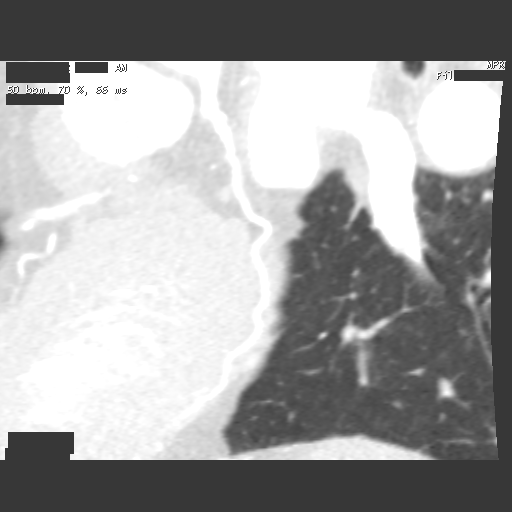
[im 17/22  vessel]
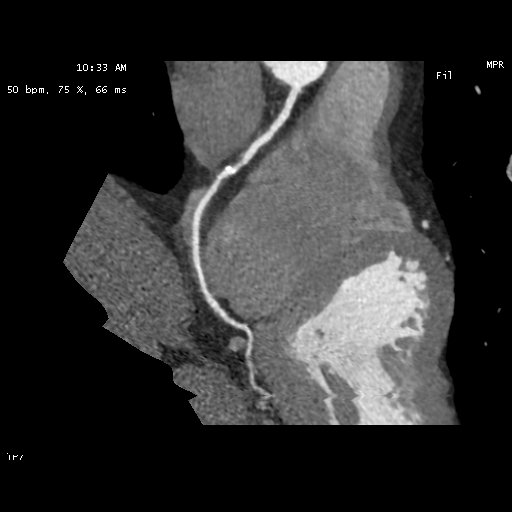
[im 18/22  vessel]
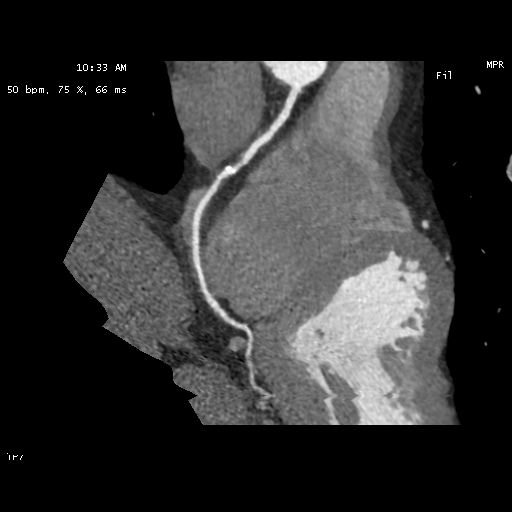
[im 20/22  vessel]
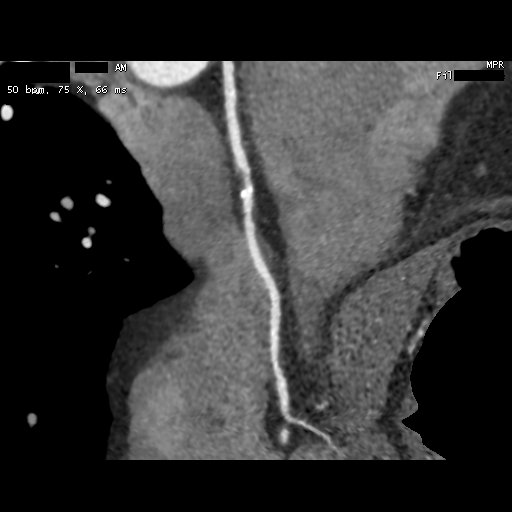

[12 of 20 positions shown; findings below may reference images not displayed]

FINDINGS: Vascular: Heart is normal size. Aorta is normal caliber. Scattered
aortic valve calcifications. Slight irregularity noted along the
descending thoracic aorta, likely noncalcified plaque. No aneurysm.

Mediastinum/Nodes: No adenopathy

Lungs/Pleura: No confluent opacity or effusion.

Upper Abdomen: Imaging into the upper abdomen demonstrates no acute
findings.

Musculoskeletal: Chest wall soft tissues are unremarkable. No acute
bony abnormality.
IMPRESSION: Slight irregularity noted within the descending thoracic aorta,
likely noncalcified plaque.

Scattered calcifications within the aortic valve.

No acute extra cardiac abnormality.
FINDINGS: Image quality: excellent.

Noise artifact is: Limited.

Coronary Arteries:  Normal coronary origin.  Right dominance.

Left main: The left main is a large caliber vessel with a normal
take off from the left coronary cusp that bifurcates to form a left
anterior descending artery and a left circumflex artery. The distal
left main contains minimal calcified plaque (<25%).

Left anterior descending artery: The proximal LAD contains minimal
calcified plaque (<25%). The mid LAD contains non-calcified plaque
(25-49%). There is stairstep artifact but this is interpretable. The
distal LAD contains minimal non-calcified plaque (<25%). The LAD
gives off 2 patent diagonal branches.

Left circumflex artery: The LCX is non-dominant. The proximal LCX
contains minimal calcified plaque (<25%). The LCX gives off 2 patent
obtuse marginal branches.

Right coronary artery: The RCA is dominant with normal take off from
the right coronary cusp. The mid RCA contains mild mixed density
plaque that is mild to moderate (40-50%). The RCA terminates as a
PDA and right posterolateral branch without evidence of plaque or
stenosis.

Right Atrium: Right atrial size is within normal limits.

Right Ventricle: The right ventricular cavity is within normal
limits.

Left Atrium: Left atrial size is normal in size with no left atrial
appendage filling defect.

Left Ventricle: The ventricular cavity size is within normal limits.
There are no stigmata of prior infarction. There is no abnormal
filling defect.

Pulmonary arteries: Normal in size without proximal filling defect.

Pulmonary veins: Normal pulmonary venous drainage.

Pericardium: Normal thickness with no significant effusion or
calcium present.

Cardiac valves: The aortic valve is trileaflet without significant
calcification. The mitral valve is normal structure without
significant calcification.

Aorta: Normal caliber with no significant disease.

Extra-cardiac findings: See attached radiology report for
non-cardiac structures.
IMPRESSION: 1. Coronary calcium score of 39. This was 27th percentile for age
and sex matched controls.

2. Normal coronary origin with right dominance.

3. Mild CAD (25-49%) in the mid LAD.

4. Minimal CAD (<25%) in the prox LCX.

5. Mild to moderate CAD in the mid RCA (40-50%).

RECOMMENDATIONS:
1. Mild to moderate CAD in the mid RCA. Consider non-atherosclerotic
causes of chest pain. Consider preventive therapy and risk factor
modification. Additional analysis with CT FFR will be submitted.

*** End of Addendum ***
EXAM:
OVER-READ INTERPRETATION  CT CHEST

The following report is an over-read performed by radiologist Dr.
Nabii Isaya Kiriga [REDACTED] on 01/19/2021. This over-read
does not include interpretation of cardiac or coronary anatomy or
pathology. The coronary CTA interpretation by the cardiologist is
attached.
FINDINGS: Vascular: Heart is normal size. Aorta is normal caliber. Scattered
aortic valve calcifications. Slight irregularity noted along the
descending thoracic aorta, likely noncalcified plaque. No aneurysm.

Mediastinum/Nodes: No adenopathy

Lungs/Pleura: No confluent opacity or effusion.

Upper Abdomen: Imaging into the upper abdomen demonstrates no acute
findings.

Musculoskeletal: Chest wall soft tissues are unremarkable. No acute
bony abnormality.
IMPRESSION: Slight irregularity noted within the descending thoracic aorta,
likely noncalcified plaque.

Scattered calcifications within the aortic valve.

No acute extra cardiac abnormality.

## 2022-11-18 NOTE — Progress Notes (Signed)
Synopsis: Referred for pulmonary nodules by Sherle Poe, MD  Subjective:   PATIENT ID: Darren Nixon GENDER: male DOB: 31-Mar-1946, MRN: [id number]  Chief Complaint  Patient presents with   Consult    Abn cxr and CT.  Refer by [facility].  Cough persistent after getting sick October 2023 on a cruise.   76yM with history of CAD, remote smoking quit 1986 20 py referred for pulmonary nodules from [place] sick on a cruise in 08/2022. Went to [place], [place].   No cough now. No fever. Minor DOE, snoring at night. No unintentional weight loss. No drenching night sweats.    Otherwise pertinent review of systems is negative.  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 No MJ, vaping. He has never lived outside [place]. Was in [place]. Was in [place]. Lots of Agent Orange exposure there.    Past Medical History:  Diagnosis Date   Coronary artery disease    Hyperlipidemia      Family History  Problem Relation Age of Onset   Heart attack Father    Heart failure Father    Kidney disease Brother      No past surgical history on file.  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  Tobacco Use   Smoking status: Former    Packs/day: 1.00    Years: 20.00    Total pack years: 20.00    Types: Cigarettes    Quit date: 1986    Years since quitting: 38.0   Smokeless tobacco: Never  Substance and Sexual Activity   Alcohol use: Yes   Drug use: Never   Sexual activity: Not on file  Other Topics Concern   Not on file  Social History Narrative   Not on file   Social Determinants of Health   Financial Resource Strain: Not on file  Food Insecurity: Not on file  Transportation Needs: Not on  file  Physical Activity: Not on file  Stress: Not on file  Social Connections: Not on file  Intimate Partner Violence: Not on file     Allergies  Allergen Reactions   Propofol Swelling     Outpatient Medications Prior to Visit  Medication Sig Dispense Refill   ALPRAZolam (XANAX) 0.25 MG tablet Take 0.25 mg by mouth at bedtime as needed for anxiety.     aspirin EC 81 MG tablet Take 81 mg by mouth daily. Swallow whole.     atorvastatin (LIPITOR) 80 MG tablet Take 1 tablet by mouth once daily 90 tablet 1   ezetimibe (ZETIA) 10 MG tablet Take 1 tablet by mouth once daily 90 tablet 1   famciclovir (FAMVIR) 500 MG tablet Take 500 mg by mouth 2 (two) times daily. Take 2 tablets by mouth twice daily for fever blister as needed.     fluticasone (FLONASE) 50 MCG/ACT nasal spray Place into both nostrils daily.     meloxicam (MOBIC) 15 MG tablet Take 15 mg by mouth daily.     Omega-3 Fatty Acids (FISH OIL) 1000 MG CAPS Take by mouth.     Probiotic Product (PROBIOTIC ADVANCED PO) Take by mouth.     tadalafil (  CIALIS) 5 MG tablet Take 5 mg by mouth daily as needed for erectile dysfunction.     metoprolol tartrate (LOPRESSOR) 25 MG tablet Take 1 tablet (25 mg total) by mouth 2 (two) times daily. 180 tablet 3   No facility-administered medications prior to visit.       Objective:   Physical Exam:  General appearance: 77 y.o., male, NAD, conversant  Eyes: anicteric sclerae; PERRL, tracking appropriately HENT: NCAT; MMM Neck: Trachea midline; no lymphadenopathy, no JVD Lungs: CTAB, no crackles, no wheeze, with normal respiratory effort CV: RRR, no murmur  Abdomen: Soft, non-tender; non-distended, BS present  Extremities: No peripheral edema, warm Skin: Normal turgor and texture; no rash Psych: Appropriate affect Neuro: Alert and oriented to person and place, no focal deficit     Vitals:   11/22/22 1417  BP: 128/68  Pulse: (!) 53  Temp: 98.4 F (36.9 C)  TempSrc: Oral  SpO2: 97%   Weight: 225 lb 6.4 oz (102.2 kg)  Height: '6\' 1"'$  (1.854 m)   97% on RA BMI Readings from Last 3 Encounters:  11/22/22 29.74 kg/m  08/16/21 28.05 kg/m  04/12/21 28.37 kg/m   Wt Readings from Last 3 Encounters:  11/22/22 225 lb 6.4 oz (102.2 kg)  08/16/21 212 lb 9.6 oz (96.4 kg)  04/12/21 215 lb (97.5 kg)     CBC No results found for: "WBC", "RBC", "HGB", "HCT", "PLT", "MCV", "MCH", "MCHC", "RDW", "LYMPHSABS", "MONOABS", "EOSABS", "BASOSABS"   Chest Imaging: CT Chest 11/13/22 reviewed by me with multiple nodules, dominant solid 76m LLL stable since 01/19/21  11/13/22:    01/2021:   Pulmonary Functions Testing Results:     No data to display            Echocardiogram 02/07/21:    1. Left ventricular ejection fraction, by estimation, is 60 to 65%. The  left ventricle has normal function. The left ventricle has no regional  wall motion abnormalities. Left ventricular diastolic parameters were  normal.   2. Right ventricular systolic function is normal. The right ventricular  size is normal.   3. The mitral valve is normal in structure. Mild mitral valve  regurgitation.   4. The aortic valve is tricuspid. Aortic valve regurgitation is not  visualized. Mild to moderate aortic valve sclerosis/calcification is  present, without any evidence of aortic stenosis.   5. The inferior vena cava is dilated in size with >50% respiratory  variability, suggesting right atrial pressure of 8 mmHg.       Assessment & Plan:   # Pulmonary nodules  Dominant is 689mLLL, stable since 01/2021, near 2 years of stability  [facility] would stop surveillance of it. If any interval growth we'd be glad to see you again in clinic to discuss next steps.       NaMaryjane HurterMD [facility] 11/22/2022 4:50 PM

## 2022-11-22 ENCOUNTER — Ambulatory Visit (INDEPENDENT_AMBULATORY_CARE_PROVIDER_SITE_OTHER): Payer: No Typology Code available for payment source | Admitting: Student

## 2022-11-22 ENCOUNTER — Encounter: Payer: Self-pay | Admitting: Student

## 2022-11-22 VITALS — BP 128/68 | HR 53 | Temp 98.4°F | Ht 73.0 in | Wt 225.4 lb

## 2022-11-22 DIAGNOSIS — R918 Other nonspecific abnormal finding of lung field: Secondary | ICD-10-CM | POA: Diagnosis not present

## 2022-11-22 NOTE — Patient Instructions (Signed)
-  Dominant 14m pulmonary nodule is stable since March 2022 CT Chest.  - Would recommend repeat CT Chest in January 2025. If stable on that CT Chest then would stop surveillance of it. If any interval growth we'd be glad to see you again in clinic to discuss next steps.

## 2023-01-12 DIAGNOSIS — M7551 Bursitis of right shoulder: Secondary | ICD-10-CM | POA: Diagnosis not present

## 2023-01-31 DIAGNOSIS — S90829A Blister (nonthermal), unspecified foot, initial encounter: Secondary | ICD-10-CM | POA: Diagnosis not present

## 2023-01-31 DIAGNOSIS — S9032XA Contusion of left foot, initial encounter: Secondary | ICD-10-CM | POA: Diagnosis not present

## 2023-02-16 DIAGNOSIS — M7551 Bursitis of right shoulder: Secondary | ICD-10-CM | POA: Diagnosis not present

## 2023-03-02 DIAGNOSIS — M7551 Bursitis of right shoulder: Secondary | ICD-10-CM | POA: Diagnosis not present

## 2023-03-02 DIAGNOSIS — M25511 Pain in right shoulder: Secondary | ICD-10-CM | POA: Diagnosis not present

## 2023-03-12 DIAGNOSIS — I700 Atherosclerosis of aorta: Secondary | ICD-10-CM | POA: Diagnosis not present

## 2023-03-12 DIAGNOSIS — R972 Elevated prostate specific antigen [PSA]: Secondary | ICD-10-CM | POA: Diagnosis not present

## 2023-03-12 DIAGNOSIS — I2510 Atherosclerotic heart disease of native coronary artery without angina pectoris: Secondary | ICD-10-CM | POA: Diagnosis not present

## 2023-03-12 DIAGNOSIS — R911 Solitary pulmonary nodule: Secondary | ICD-10-CM | POA: Diagnosis not present

## 2023-03-12 DIAGNOSIS — E669 Obesity, unspecified: Secondary | ICD-10-CM | POA: Diagnosis not present

## 2023-03-12 DIAGNOSIS — I77819 Aortic ectasia, unspecified site: Secondary | ICD-10-CM | POA: Diagnosis not present

## 2023-03-12 DIAGNOSIS — G4730 Sleep apnea, unspecified: Secondary | ICD-10-CM | POA: Diagnosis not present

## 2023-03-12 DIAGNOSIS — M150 Primary generalized (osteo)arthritis: Secondary | ICD-10-CM | POA: Diagnosis not present

## 2023-03-12 DIAGNOSIS — N400 Enlarged prostate without lower urinary tract symptoms: Secondary | ICD-10-CM | POA: Diagnosis not present

## 2023-03-12 DIAGNOSIS — F411 Generalized anxiety disorder: Secondary | ICD-10-CM | POA: Diagnosis not present

## 2023-03-12 DIAGNOSIS — Z0000 Encounter for general adult medical examination without abnormal findings: Secondary | ICD-10-CM | POA: Diagnosis not present

## 2023-03-12 DIAGNOSIS — E782 Mixed hyperlipidemia: Secondary | ICD-10-CM | POA: Diagnosis not present

## 2023-03-14 ENCOUNTER — Other Ambulatory Visit: Payer: Self-pay | Admitting: Family Medicine

## 2023-03-14 DIAGNOSIS — I77819 Aortic ectasia, unspecified site: Secondary | ICD-10-CM

## 2023-03-22 DIAGNOSIS — M75101 Unspecified rotator cuff tear or rupture of right shoulder, not specified as traumatic: Secondary | ICD-10-CM | POA: Diagnosis not present

## 2023-04-17 ENCOUNTER — Other Ambulatory Visit: Payer: Self-pay | Admitting: Cardiovascular Disease

## 2023-04-20 ENCOUNTER — Ambulatory Visit
Admission: RE | Admit: 2023-04-20 | Discharge: 2023-04-20 | Disposition: A | Discharge status: Home / Self Care | Payer: Medicare Other | Source: Ambulatory Visit | Attending: Family Medicine | Admitting: Family Medicine

## 2023-04-20 DIAGNOSIS — E785 Hyperlipidemia, unspecified: Secondary | ICD-10-CM | POA: Diagnosis not present

## 2023-04-20 DIAGNOSIS — I77819 Aortic ectasia, unspecified site: Secondary | ICD-10-CM | POA: Diagnosis not present

## 2023-04-20 DIAGNOSIS — Z87891 Personal history of nicotine dependence: Secondary | ICD-10-CM | POA: Diagnosis not present

## 2023-04-20 DIAGNOSIS — I10 Essential (primary) hypertension: Secondary | ICD-10-CM | POA: Diagnosis not present

## 2023-04-20 DIAGNOSIS — Z136 Encounter for screening for cardiovascular disorders: Secondary | ICD-10-CM | POA: Diagnosis not present

## 2023-05-18 DIAGNOSIS — I2510 Atherosclerotic heart disease of native coronary artery without angina pectoris: Secondary | ICD-10-CM | POA: Diagnosis not present

## 2023-05-18 DIAGNOSIS — E782 Mixed hyperlipidemia: Secondary | ICD-10-CM | POA: Diagnosis not present

## 2023-05-18 DIAGNOSIS — R6884 Jaw pain: Secondary | ICD-10-CM | POA: Diagnosis not present

## 2023-05-18 DIAGNOSIS — I440 Atrioventricular block, first degree: Secondary | ICD-10-CM | POA: Diagnosis not present

## 2023-05-23 ENCOUNTER — Other Ambulatory Visit: Payer: Self-pay | Admitting: Cardiovascular Disease

## 2023-05-29 ENCOUNTER — Encounter (HOSPITAL_COMMUNITY): Payer: Self-pay | Admitting: Family Medicine

## 2023-05-29 DIAGNOSIS — R6884 Jaw pain: Secondary | ICD-10-CM

## 2023-06-11 DIAGNOSIS — H353131 Nonexudative age-related macular degeneration, bilateral, early dry stage: Secondary | ICD-10-CM | POA: Diagnosis not present

## 2023-06-11 DIAGNOSIS — Z961 Presence of intraocular lens: Secondary | ICD-10-CM | POA: Diagnosis not present

## 2023-06-11 DIAGNOSIS — H35373 Puckering of macula, bilateral: Secondary | ICD-10-CM | POA: Diagnosis not present

## 2023-06-11 DIAGNOSIS — H43813 Vitreous degeneration, bilateral: Secondary | ICD-10-CM | POA: Diagnosis not present

## 2023-06-24 NOTE — Progress Notes (Unsigned)
Cardiology Clinic Note   Patient Name: Darren Nixon Date of Encounter: 06/25/2023  Primary Care Provider:  Clovis Riley, L.August Saucer, MD Primary Cardiologist:  Reatha Harps, MD  Patient Profile    77 year old male with a hx of non-obstructive CAD, HLD, on Lipitor and Zeta. Last seen by Dr. Flora Lipps on 08/16/2021   Past Medical History    Past Medical History:  Diagnosis Date   Coronary artery disease    Hyperlipidemia    No past surgical history on file.  Allergies  Allergies  Allergen Reactions   Propofol Swelling    History of Present Illness    Mr. Darren Nixon presents today with known history of CAD and hyperlipidemia.  He was working in his yard 2 weeks ago cutting down trees and clearing brush when he began to have significant jaw pain bilaterally.  It was worse than he had ever felt before in the past.  He had had some occasional jaw earlier but did not experience it to the degree of intensity.  He was already exerting himself and therefore he was having diaphoresis normally.  He did not complain of any shortness of breath over exertional level, nor did he have dizziness lightheadedness presyncope.  He stopped his activity and went inside and rested and pain went away within 5 to 10 minutes.  As result of this he called his primary care provider Dr. Lewie Chamber who saw him and performed an EKG which was normal.  He was referred back to cardiology due to his recurrent symptoms.  Since that episode the patient has not had any recurrence of the jaw pain.  He is not had any issues with chest pain, energy level has remained the same.  He has recently been diagnosed with OSA and is currently on CPAP now.  He is also on medication for enlarged prostate which is new since being seen last.  Home Medications    Current Outpatient Medications  Medication Sig Dispense Refill   ALPRAZolam (XANAX) 0.25 MG tablet Take 0.25 mg by mouth at bedtime as needed for anxiety.     aspirin EC 81 MG  tablet Take 81 mg by mouth daily. Swallow whole.     atorvastatin (LIPITOR) 80 MG tablet TAKE 1 TABLET BY MOUTH ONCE DAILY . APPOINTMENT REQUIRED FOR FUTURE REFILLS 50 tablet 0   ezetimibe (ZETIA) 10 MG tablet Take 1 tablet by mouth once daily 50 tablet 0   Krill Oil (OMEGA-3) 500 MG CAPS Take 500 mg by mouth daily.     meloxicam (MOBIC) 15 MG tablet Take 15 mg by mouth daily.     metoprolol tartrate (LOPRESSOR) 25 MG tablet Take 25 mg by mouth daily at 6 (six) AM.     Multiple Vitamins-Minerals (EYE VITAMINS) CAPS Take 1 capsule by mouth daily at 6 (six) AM.     nitroGLYCERIN (NITROSTAT) 0.4 MG SL tablet Place 1 tablet (0.4 mg total) under the tongue every 5 (five) minutes as needed for chest pain. 25 tablet 3   Probiotic Product (PROBIOTIC ADVANCED PO) Take by mouth.     tadalafil (CIALIS) 5 MG tablet Take 5 mg by mouth daily as needed for erectile dysfunction.     tamsulosin (FLOMAX) 0.4 MG CAPS capsule Take 0.4 mg by mouth daily.     famciclovir (FAMVIR) 500 MG tablet Take 500 mg by mouth 2 (two) times daily. Take 2 tablets by mouth twice daily for fever blister as needed. (Patient not taking: Reported on 06/25/2023)  fluticasone (FLONASE) 50 MCG/ACT nasal spray Place into both nostrils daily. (Patient not taking: Reported on 06/25/2023)     metoprolol tartrate (LOPRESSOR) 25 MG tablet Take 1 tablet by mouth twice daily (Patient not taking: Reported on 06/25/2023) 100 tablet 0   Omega-3 Fatty Acids (FISH OIL) 1000 MG CAPS Take by mouth. (Patient not taking: Reported on 06/25/2023)     valACYclovir HCl (VALTREX PO) Take 15 mg by mouth as needed. (Patient not taking: Reported on 06/25/2023)     No current facility-administered medications for this visit.     Family History    Family History  Problem Relation Age of Onset   Heart attack Father    Heart failure Father    Kidney disease Brother    He indicated that his mother is deceased. He indicated that his father is deceased. He  indicated that the status of his brother is unknown.  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  Tobacco Use   Smoking status: Former    Current packs/day: 0.00    Average packs/day: 1 pack/day for 20.0 years (20.0 ttl pk-yrs)    Types: Cigarettes    Start date: 1966    Quit date: 1986    Years since quitting: 38.6   Smokeless tobacco: Never  Substance and Sexual Activity   Alcohol use: Yes   Drug use: Never   Sexual activity: Not on file  Other Topics Concern   Not on file  Social History Narrative   Not on file   Social Determinants of Health   Financial Resource Strain: Not on file  Food Insecurity: Not on file  Transportation Needs: Not on file  Physical Activity: Not on file  Stress: Not on file  Social Connections: Not on file  Intimate Partner Violence: Not on file     Review of Systems    General:  No chills, fever, night sweats or weight changes.  Cardiovascular:  No chest pain, positive for bilateral jaw pain with exertion, dyspnea on exertion, edema, orthopnea, palpitations, paroxysmal nocturnal dyspnea. Dermatological: No rash, lesions/masses Respiratory: No cough, dyspnea Urologic: No hematuria, dysuria Abdominal:   No nausea, vomiting, diarrhea, bright red blood per rectum, melena, or hematemesis Neurologic:  No visual changes, wkns, changes in mental status. All other systems reviewed and are otherwise negative except as noted above.       Physical Exam    VS:  BP 134/66 (BP Location: Left Arm, Patient Position: Sitting, Cuff Size: Large)   Pulse (!) 44   Ht 6\' 1"  (1.854 m)   Wt 238 lb 9.6 oz (108.2 kg)   SpO2 96%   BMI 31.48 kg/m  , BMI Body mass index is 31.48 kg/m.      GEN: Well nourished, well developed, in no acute distress.  Obese HEENT: normal. Neck: Supple, no JVD, carotid bruits, or masses. Cardiac: RRR, no murmurs, rubs, or gallops. No clubbing, cyanosis, edema.  Radials/DP/PT 2+ and equal bilaterally.  Respiratory:  Respirations regular and unlabored, clear to auscultation bilaterally. GI: Soft, nontender, nondistended, BS + x 4. MS: no deformity or atrophy. Skin: warm and dry, no rash. Neuro:  Strength and sensation are intact.  Psych: Normal affect.      No results found for: "WBC", "HGB", "HCT", "MCV", "PLT" Lab Results  Component Value Date   CREATININE 0.89 01/10/2021   BUN 19 01/10/2021   NA 140 01/10/2021   K 4.7 01/10/2021   CL 105 01/10/2021   CO2 22 01/10/2021   No results found for: "ALT", "AST", "GGT", "ALKPHOS", "BILITOT" Lab Results  Component Value Date   CHOL 150 04/12/2021   HDL 59 04/12/2021   LDLCALC 72 04/12/2021   TRIG 105 04/12/2021   CHOLHDL 2.5 04/12/2021    No results found for: "HGBA1C"   Review of Prior Studies    TTE 02/07/2021  1. Left ventricular ejection fraction, by estimation, is 60 to 65%. The  left ventricle has normal function. The left ventricle has no regional  wall motion abnormalities. Left ventricular diastolic parameters were  normal.   2. Right ventricular systolic function is normal. The right ventricular  size is normal.   3. The mitral valve is normal in structure. Mild mitral valve  regurgitation.   4. The aortic valve is tricuspid. Aortic valve regurgitation is not  visualized. Mild to moderate aortic valve sclerosis/calcification is  present, without any evidence of aortic stenosis.   5. The inferior vena cava is dilated in size with >50% respiratory  variability, suggesting right atrial pressure of 8 mmHg.    CCTA 01/19/2021 IMPRESSION: 1. Coronary calcium score of 39. This was 27th percentile for age and sex matched controls.   2. Normal coronary origin with right  dominance.   3. Mild CAD (25-49%) in the mid LAD.   4. Minimal CAD (<25%) in the prox LCX.   5. Mild to moderate CAD in the mid RCA (40-50%).   CT FFR 04/08/2021 1. Left Main: 0.98; no significant stenosis.   2. Proximal LAD: 0.94; no significant stenosis. 3. Mid LAD: 0.91; no significant stenosis. 4. Distal LAD: 0.82->0.74; borderline. 5. LCX: 0.95; no significant stenosis. 6. Proximal RCA: 0.96; no significant stenosis. 7. Mid RCA: 0.78; borderline significant stenosis. 8. Distal RCA: 0.74; significant stenosis.   IMPRESSION: 1. Obstructive CAD in the distal LAD (CT FFR 0.82->0.74).   2. Mid RCA is borderline positive (CT FFR 0.78).  Assessment & Plan   1.CAD: Patient had coronary CTA in 2022 which revealed obstructive CAD in the distal LAD and mid RCA borderline positive with FFR of 0.78.  Patient is having jaw pain with exertion.  He was having mild jaw pain in the past with exertion but most recently while working in his yard jaw pain was pretty intense causing him to stop what he was doing and be seen by PCP.  EKG today shows sinus bradycardia heart rate 49 bpm he is on metoprolol 25 mg daily.  I will schedule a nuclear medicine stress test for diagnostic prognostic purposes.  He will hold metoprolol prior to the test to allow for maximum heart rate during exercise.  I have explained the procedure to him and he is willing to proceed.  I have given him a prescription for nitroglycerin sublingual to use when he has significant jaw pain.  Will discuss results of test once available with need to continue medical management versus proceeding with cardiac catheterization after discussing this with cardiologist.  2.  Hypercholesterolemia: Patient is on atorvastatin 80 mg daily refills are provided.  I do not have a recent lipid and LFT.  He is being followed by PCP for labs.  If these are not completed on  follow-up we will repeat them.  3.  Bradycardia: May need to adjust metoprolol dose  if no evidence of reversible ischemia in the RCA territory on nuclear medicine scan.  Currently he is on metoprolol 25 mg daily.  4.  OSA: Recently diagnosed and is now on CPAP.  Followed by [facility].  5.  Obesity: BMI 31.4.  Significant cardiovascular risk factor.  Weight loss, purposeful exercise, and low-cholesterol low caloric diet for cardiac health is recommended.    Informed Consent   Shared Decision Making/Informed Consent The risks [chest pain, shortness of breath, cardiac arrhythmias, dizziness, blood pressure fluctuations, myocardial infarction, stroke/transient ischemic attack, nausea, vomiting, allergic reaction, radiation exposure, metallic taste sensation and life-threatening complications (estimated to be 1 in 10,000)], benefits (risk stratification, diagnosing coronary artery disease, treatment guidance) and alternatives of a nuclear stress test were discussed in detail with Mr. Davtyan and he agrees to proceed.      Signed, Bettey Mare. Liborio Nixon, ANP, AACC   06/25/2023 4:32 PM      Office [phone] Fax [phone]  Notice: This dictation was prepared with Dragon dictation along with smaller phrase technology. Any transcriptional errors that result from this process are unintentional and may not be corrected upon review.

## 2023-06-25 ENCOUNTER — Ambulatory Visit: Payer: Medicare Other | Attending: Adult Health | Admitting: Adult Health

## 2023-06-25 ENCOUNTER — Encounter: Payer: Self-pay | Admitting: Adult Health

## 2023-06-25 VITALS — BP 134/66 | HR 44 | Ht 73.0 in | Wt 238.6 lb

## 2023-06-25 DIAGNOSIS — I25119 Atherosclerotic heart disease of native coronary artery with unspecified angina pectoris: Secondary | ICD-10-CM | POA: Diagnosis not present

## 2023-06-25 DIAGNOSIS — I10 Essential (primary) hypertension: Secondary | ICD-10-CM

## 2023-06-25 MED ORDER — NITROGLYCERIN 0.4 MG SL SUBL: 0.4000 mg | SUBLINGUAL_TABLET | SUBLINGUAL | 3 refills | Status: AC | PRN

## 2023-06-25 NOTE — Patient Instructions (Signed)
Medication Instructions:  Nitroglycerin 0.4 mg ( As Directed). *If you need a refill on your cardiac medications before your next appointment, please call your pharmacy*   Lab Work: No Labs If you have labs (blood work) drawn today and your tests are completely normal, you will receive your results only by: MyChart Message (if you have MyChart) OR A paper copy in the mail If you have any lab test that is abnormal or we need to change your treatment, we will call you to review the results.   Testing/Procedures: [address], [address]. Your physician has requested that you have a lexiscan myoview. For further information please visit [email or web address] Please follow instruction sheet, as given.    Follow-Up: At [facility], you and your health needs are our priority.  As part of our continuing mission to provide you with exceptional heart care, we have created designated Provider Care Teams.  These Care Teams include your primary Cardiologist (physician) and Advanced Practice Providers (APPs -  Physician Assistants and Nurse Practitioners) who all work together to provide you with the care you need, when you need it.  We recommend signing up for the patient portal called "MyChart".  Sign up information is provided on this After Visit Summary.  MyChart is used to connect with patients for Virtual Visits (Telemedicine).  Patients are able to view lab/test results, encounter notes, upcoming appointments, etc.  Non-urgent messages can be sent to your provider as well.   To learn more about what you can do with MyChart, go to [email or web address].    Your next appointment:   4 week(s) (Post Testing).  Provider:   Joni Reining, DNP, ANP

## 2023-06-26 ENCOUNTER — Encounter (HOSPITAL_COMMUNITY): Payer: Self-pay

## 2023-07-06 ENCOUNTER — Ambulatory Visit (HOSPITAL_COMMUNITY): Payer: Medicare Other | Attending: Adult Health

## 2023-07-06 VITALS — Ht 73.0 in | Wt 238.0 lb

## 2023-07-06 DIAGNOSIS — E782 Mixed hyperlipidemia: Secondary | ICD-10-CM

## 2023-07-06 DIAGNOSIS — I25119 Atherosclerotic heart disease of native coronary artery with unspecified angina pectoris: Secondary | ICD-10-CM | POA: Diagnosis not present

## 2023-07-06 DIAGNOSIS — I10 Essential (primary) hypertension: Secondary | ICD-10-CM | POA: Insufficient documentation

## 2023-07-06 LAB — MYOCARDIAL PERFUSION IMAGING
Estimated workload: 8.2
Exercise duration (min): 7 min
Exercise duration (sec): 10 s
LV dias vol: 112 mL (ref 62–150)
LV sys vol: 35 mL
MPHR: 145 {beats}/min
Nuc Stress EF: 69 %
Peak HR: 137 {beats}/min
Percent HR: 94 %
Rest HR: 54 {beats}/min
Rest Nuclear Isotope Dose: 10.5 mCi
SDS: 0
SRS: 0
SSS: 0
ST Depression (mm): 0 mm
Stress Nuclear Isotope Dose: 32.4 mCi
TID: 0.9

## 2023-07-06 MED ORDER — TECHNETIUM TC 99M TETROFOSMIN IV KIT
32.4000 | PACK | Freq: Once | INTRAVENOUS | Status: AC | PRN
  Administered 2023-07-06: 32.4 via INTRAVENOUS

## 2023-07-06 MED ORDER — TECHNETIUM TC 99M TETROFOSMIN IV KIT
10.5000 | PACK | Freq: Once | INTRAVENOUS | Status: AC | PRN
  Administered 2023-07-06: 10.5 via INTRAVENOUS

## 2023-07-10 ENCOUNTER — Telehealth: Payer: Self-pay

## 2023-07-10 NOTE — Telephone Encounter (Addendum)
Called patient regarding results. Patient had understanding of results.----- Message from Joni Reining sent at 07/08/2023  5:07 PM EDT ----- I have reviewed his stress test results. The test was normal, no areas of low blood flow to the heart muscle which would indicate blocked arteries. The study is low risk for decreased blood flow causing heart attack.  Jaw pan from exertion is not related to his heart blood flow. Call if symptoms return.

## 2023-07-12 ENCOUNTER — Telehealth: Payer: Self-pay

## 2023-07-12 NOTE — Telephone Encounter (Addendum)
Called patient regarding results. Patient had understanding of results.----- Message from Joni Reining sent at 07/08/2023  5:07 PM EDT ----- I have reviewed his stress test results. The test was normal, no areas of low blood flow to the heart muscle which would indicate blocked arteries. The study is low risk for decreased blood flow causing heart attack.  Jaw pan from exertion is not related to his heart blood flow. Call if symptoms return.

## 2023-07-13 ENCOUNTER — Other Ambulatory Visit: Payer: Self-pay | Admitting: Cardiovascular Disease

## 2023-07-28 NOTE — Progress Notes (Unsigned)
Cardiology Clinic Note   Patient Name: Darren Nixon Date of Encounter: 07/30/2023  Primary Care Provider:  Irven Coe, MD Primary Cardiologist:  Reatha Harps, MD  Patient Profile    77 year old male with a hx of non-obstructive CAD, HLD, on Lipitor and Zeta. He has recently been diagnosed with OSA and is currently on CPAP now.  Last seen by me with complaints of jaw pain with severe exertion working out in his yard.  He was scheduled for a nuclear medicine stress test to evaluate for ischemia.  This was completed on 07/06/2023 and was found to be negative for ischemia with no areas of decreased blood flow and was found to be low risk.  Past Medical History    Past Medical History:  Diagnosis Date   Coronary artery disease    Hyperlipidemia    No past surgical history on file.  Allergies  Allergies  Allergen Reactions   Propofol Swelling    History of Present Illness    Darren Nixon comes today for ongoing assessment and management of nonobstructive CAD, hyperlipidemia.  He was complaining of jaw pain when last seen and nuclear medicine stress test was ordered which was normal and found to be low risk.  He has no complaints of recurrent jaw pain, dyspnea on exertion, fatigue, chest discomfort, or palpitations.  He is concerned about weight gain.  He does not do any purposeful exercise but does play golf a couple times a month.  He has had recent labs by his primary care provider which has been changed to Dr. Lewie Chamber.  He is medically compliant.  Home Medications    Current Outpatient Medications  Medication Sig Dispense Refill   ALPRAZolam (XANAX) 0.25 MG tablet Take 0.25 mg by mouth at bedtime as needed for anxiety.     aspirin EC 81 MG tablet Take 81 mg by mouth daily. Swallow whole.     atorvastatin (LIPITOR) 80 MG tablet Take 1 tablet (80 mg total) by mouth daily. 90 tablet 3   ezetimibe (ZETIA) 10 MG tablet Take 1 tablet by mouth once daily 50 tablet 0   Krill Oil  (OMEGA-3) 500 MG CAPS Take 500 mg by mouth daily.     meloxicam (MOBIC) 15 MG tablet Take 15 mg by mouth daily.     metoprolol tartrate (LOPRESSOR) 25 MG tablet Take 25 mg by mouth daily at 6 (six) AM.     Multiple Vitamins-Minerals (EYE VITAMINS) CAPS Take 1 capsule by mouth daily at 6 (six) AM.     nitroGLYCERIN (NITROSTAT) 0.4 MG SL tablet Place 1 tablet (0.4 mg total) under the tongue every 5 (five) minutes as needed for chest pain. 25 tablet 3   Probiotic Product (PROBIOTIC ADVANCED PO) Take by mouth.     tadalafil (CIALIS) 5 MG tablet Take 5 mg by mouth daily as needed for erectile dysfunction.     tamsulosin (FLOMAX) 0.4 MG CAPS capsule Take 0.4 mg by mouth daily.     famciclovir (FAMVIR) 500 MG tablet Take 500 mg by mouth 2 (two) times daily. Take 2 tablets by mouth twice daily for fever blister as needed. (Patient not taking: Reported on 06/25/2023)     fluticasone (FLONASE) 50 MCG/ACT nasal spray Place into both nostrils daily. (Patient not taking: Reported on 06/25/2023)     metoprolol tartrate (LOPRESSOR) 25 MG tablet Take 1 tablet by mouth twice daily (Patient not taking: Reported on 06/25/2023) 100 tablet 0   Omega-3 Fatty Acids (FISH  OIL) 1000 MG CAPS Take by mouth. (Patient not taking: Reported on 06/25/2023)     valACYclovir HCl (VALTREX PO) Take 15 mg by mouth as needed. (Patient not taking: Reported on 06/25/2023)     No current facility-administered medications for this visit.     Family History    Family History  Problem Relation Age of Onset   Heart attack Father    Heart failure Father    Kidney disease Brother    He indicated that his mother is deceased. He indicated that his father is deceased. He indicated that the status of his brother is unknown.  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  Tobacco Use   Smoking status: Former    Current packs/day: 0.00    Average packs/day: 1 pack/day for 20.0 years (20.0 ttl pk-yrs)    Types: Cigarettes    Start date: 1966    Quit date: 1986    Years since quitting: 38.7   Smokeless tobacco: Never  Substance and Sexual Activity   Alcohol use: Yes   Drug use: Never   Sexual activity: Not on file  Other Topics Concern   Not on file  Social History Narrative   Not on file   Social Determinants of Health   Financial Resource Strain: Not on file  Food Insecurity: Not on file  Transportation Needs: Not on file  Physical Activity: Not on file  Stress: Not on file  Social Connections: Not on file  Intimate Partner Violence: Not on file     Review of Systems    General:  No chills, fever, night sweats or weight changes.  Cardiovascular:  No chest pain, dyspnea on exertion, edema, orthopnea, palpitations, paroxysmal nocturnal dyspnea. Dermatological: No rash, lesions/masses Respiratory: No cough, dyspnea Urologic: No hematuria, dysuria Abdominal:   No nausea, vomiting, diarrhea, bright red blood per rectum, melena, or hematemesis Neurologic:  No visual changes, wkns, changes in mental status. All other systems reviewed and are otherwise negative except as noted above.       Physical Exam    VS:  BP 132/74 (BP Location: Left Arm, Patient Position: Sitting, Cuff Size: Normal)   Pulse (!) 53   Ht 6\' 1"  (1.854 m)   Wt 238 lb 12.8 oz (108.3 kg)   SpO2 97%   BMI 31.51 kg/m  , BMI Body mass index is 31.51 kg/m.     GEN: Well nourished, well developed, in no acute distress. HEENT: normal. Neck: Supple, no JVD, carotid bruits, or masses. Cardiac: RRR, no murmurs, rubs, or gallops. No clubbing, cyanosis, edema.  Radials/DP/PT 2+ and equal bilaterally.  Respiratory:  Respirations regular and unlabored, clear to auscultation  bilaterally. GI: Soft, nontender, nondistended, BS + x 4. MS: no deformity or atrophy. Skin: warm and dry, no rash. Neuro:  Strength and sensation are intact. Psych: Normal affect.      No results found for: "WBC", "HGB", "HCT", "MCV", "PLT" Lab Results  Component Value Date   CREATININE 0.89 01/10/2021   BUN 19 01/10/2021   NA 140 01/10/2021   K 4.7 01/10/2021   CL 105 01/10/2021   CO2 22 01/10/2021   No results found  for: "ALT", "AST", "GGT", "ALKPHOS", "BILITOT" Lab Results  Component Value Date   CHOL 150 04/12/2021   HDL 59 04/12/2021   LDLCALC 72 04/12/2021   TRIG 105 04/12/2021   CHOLHDL 2.5 04/12/2021    No results found for: "HGBA1C"   Review of Prior Studies  NM stress test 07/06/2023  The study is normal. The study is low risk.   Fair exercise capacity, achieved 8.2 METS   Peak heart rate 137 bpm, 94% max age-predicted heart rate   Hypertensive response to exercise, peak BP 248/85   No ST deviation was noted.   LV perfusion is normal. There is no evidence of ischemia. There is no evidence of infarction.   Left ventricular function is normal. Nuclear stress EF: 69%. The left ventricular ejection fraction is hyperdynamic (>65%). End diastolic cavity size is normal. End systolic cavity size is normal.   Prior study not available for comparison.      CCTA 01/19/2021 IMPRESSION: 1. Coronary calcium score of 39. This was 27th percentile for age and sex matched controls.   2. Normal coronary origin with right dominance.   3. Mild CAD (25-49%) in the mid LAD.   4. Minimal CAD (<25%) in the prox LCX.   5. Mild to moderate CAD in the mid RCA (40-50%).   CT FFR 04/08/2021 1. Left Main: 0.98; no significant stenosis.   2. Proximal LAD: 0.94; no significant stenosis. 3. Mid LAD: 0.91; no significant stenosis. 4. Distal LAD: 0.82->0.74; borderline. 5. LCX: 0.95; no significant stenosis. 6. Proximal RCA: 0.96; no significant stenosis. 7. Mid RCA: 0.78;  borderline significant stenosis. 8. Distal RCA: 0.74; significant stenosis.   IMPRESSION: 1. Obstructive CAD in the distal LAD (CT FFR 0.82->0.74).   2. Mid RCA is borderline positive (CT FFR 0.78).    Assessment & Plan   1.  Coronary artery disease: Patient had coronary CTA completed on 04/08/2021 which revealed obstructive CAD in the distal LAD.  Follow-up nuclear medicine stress test July 06, 2023 revealed no evidence of ischemia.  He will continue secondary prevention with lipid management, blood pressure control, weight loss, purposeful exercise.  2.  Hypercholesterolemia: Labs August 2024 revealed total cholesterol 160, HDL 56 LDL 82.  Would prefer LDL to be less than 70.  Remain is on Zetia 10 mg daily, Krill oil 500 mg daily atorvastatin 80 mg daily.  I have reinforced low-cholesterol diet and increase exercise to assist with better control.  If remains elevated may need to consider adding PCSK9 for continued prevention of progression of CAD through higher cholesterol levels.  3.  Mild obesity: He is interested in weight loss and would like to be motivated more to do so.  I have talked with him about purposeful exercise, avoiding eating out, and portion control.  He states that he had lost 40 pounds on weight watchers and did well but is beginning to gain weight again.  I have encouraged him to try going back on that regimen again as this will assist him in cardiovascular protection, and overall health.         Signed, Darren Nixon, ANP, AACC   07/30/2023 11:12 AM      Office [phone] Fax [phone]  Notice: This dictation was prepared with Dragon dictation along with smaller phrase technology. Any transcriptional errors that result from this process are unintentional and may not be corrected upon review.

## 2023-07-30 ENCOUNTER — Encounter: Payer: Self-pay | Admitting: Adult Health

## 2023-07-30 ENCOUNTER — Ambulatory Visit: Payer: Medicare Other | Attending: Adult Health | Admitting: Adult Health

## 2023-07-30 VITALS — BP 132/74 | HR 53 | Ht 73.0 in | Wt 238.8 lb

## 2023-07-30 DIAGNOSIS — I2510 Atherosclerotic heart disease of native coronary artery without angina pectoris: Secondary | ICD-10-CM | POA: Diagnosis not present

## 2023-07-30 DIAGNOSIS — E7800 Pure hypercholesterolemia, unspecified: Secondary | ICD-10-CM | POA: Insufficient documentation

## 2023-07-30 DIAGNOSIS — E663 Overweight: Secondary | ICD-10-CM | POA: Diagnosis not present

## 2023-07-30 NOTE — Patient Instructions (Signed)
Medication Instructions:  No Changes *If you need a refill on your cardiac medications before your next appointment, please call your pharmacy*   Lab Work: No Labs If you have labs (blood work) drawn today and your tests are completely normal, you will receive your results only by: MyChart Message (if you have MyChart) OR A paper copy in the mail If you have any lab test that is abnormal or we need to change your treatment, we will call you to review the results.   Testing/Procedures: No Testing   Follow-Up: At [facility], you and your health needs are our priority.  As part of our continuing mission to provide you with exceptional heart care, we have created designated Provider Care Teams.  These Care Teams include your primary Cardiologist (physician) and Advanced Practice Providers (APPs -  Physician Assistants and Nurse Practitioners) who all work together to provide you with the care you need, when you need it.  We recommend signing up for the patient portal called "MyChart".  Sign up information is provided on this After Visit Summary.  MyChart is used to connect with patients for Virtual Visits (Telemedicine).  Patients are able to view lab/test results, encounter notes, upcoming appointments, etc.  Non-urgent messages can be sent to your provider as well.   To learn more about what you can do with MyChart, go to [email or web address].    Your next appointment:   1 year(s)  Provider:   Reatha Harps, MD

## 2023-09-12 ENCOUNTER — Other Ambulatory Visit: Payer: Self-pay | Admitting: Cardiovascular Disease

## 2023-09-25 DIAGNOSIS — M75101 Unspecified rotator cuff tear or rupture of right shoulder, not specified as traumatic: Secondary | ICD-10-CM | POA: Diagnosis not present

## 2023-10-29 DIAGNOSIS — D225 Melanocytic nevi of trunk: Secondary | ICD-10-CM | POA: Diagnosis not present

## 2023-10-29 DIAGNOSIS — D2372 Other benign neoplasm of skin of left lower limb, including hip: Secondary | ICD-10-CM | POA: Diagnosis not present

## 2023-10-29 DIAGNOSIS — L570 Actinic keratosis: Secondary | ICD-10-CM | POA: Diagnosis not present

## 2023-10-29 DIAGNOSIS — L814 Other melanin hyperpigmentation: Secondary | ICD-10-CM | POA: Diagnosis not present

## 2023-10-29 DIAGNOSIS — L578 Other skin changes due to chronic exposure to nonionizing radiation: Secondary | ICD-10-CM | POA: Diagnosis not present

## 2023-10-29 DIAGNOSIS — D485 Neoplasm of uncertain behavior of skin: Secondary | ICD-10-CM | POA: Diagnosis not present

## 2023-10-29 DIAGNOSIS — L821 Other seborrheic keratosis: Secondary | ICD-10-CM | POA: Diagnosis not present

## 2023-10-29 DIAGNOSIS — Z85828 Personal history of other malignant neoplasm of skin: Secondary | ICD-10-CM | POA: Diagnosis not present

## 2023-12-10 DIAGNOSIS — Z23 Encounter for immunization: Secondary | ICD-10-CM | POA: Diagnosis not present

## 2024-01-19 ENCOUNTER — Other Ambulatory Visit: Payer: Self-pay | Admitting: Cardiovascular Disease

## 2024-02-04 DIAGNOSIS — E669 Obesity, unspecified: Secondary | ICD-10-CM | POA: Diagnosis not present

## 2024-02-04 DIAGNOSIS — I700 Atherosclerosis of aorta: Secondary | ICD-10-CM | POA: Diagnosis not present

## 2024-02-04 DIAGNOSIS — M150 Primary generalized (osteo)arthritis: Secondary | ICD-10-CM | POA: Diagnosis not present

## 2024-02-04 DIAGNOSIS — I2510 Atherosclerotic heart disease of native coronary artery without angina pectoris: Secondary | ICD-10-CM | POA: Diagnosis not present

## 2024-02-04 DIAGNOSIS — E782 Mixed hyperlipidemia: Secondary | ICD-10-CM | POA: Diagnosis not present

## 2024-03-04 DIAGNOSIS — I2510 Atherosclerotic heart disease of native coronary artery without angina pectoris: Secondary | ICD-10-CM | POA: Diagnosis not present

## 2024-03-04 DIAGNOSIS — I700 Atherosclerosis of aorta: Secondary | ICD-10-CM | POA: Diagnosis not present

## 2024-03-05 DIAGNOSIS — I700 Atherosclerosis of aorta: Secondary | ICD-10-CM | POA: Diagnosis not present

## 2024-03-05 DIAGNOSIS — I2510 Atherosclerotic heart disease of native coronary artery without angina pectoris: Secondary | ICD-10-CM | POA: Diagnosis not present

## 2024-03-05 DIAGNOSIS — E669 Obesity, unspecified: Secondary | ICD-10-CM | POA: Diagnosis not present

## 2024-03-05 DIAGNOSIS — E782 Mixed hyperlipidemia: Secondary | ICD-10-CM | POA: Diagnosis not present

## 2024-03-05 DIAGNOSIS — M150 Primary generalized (osteo)arthritis: Secondary | ICD-10-CM | POA: Diagnosis not present

## 2024-04-03 DIAGNOSIS — I700 Atherosclerosis of aorta: Secondary | ICD-10-CM | POA: Diagnosis not present

## 2024-04-03 DIAGNOSIS — I2510 Atherosclerotic heart disease of native coronary artery without angina pectoris: Secondary | ICD-10-CM | POA: Diagnosis not present

## 2024-04-03 DIAGNOSIS — E782 Mixed hyperlipidemia: Secondary | ICD-10-CM | POA: Diagnosis not present

## 2024-04-05 DIAGNOSIS — I2510 Atherosclerotic heart disease of native coronary artery without angina pectoris: Secondary | ICD-10-CM | POA: Diagnosis not present

## 2024-04-05 DIAGNOSIS — E669 Obesity, unspecified: Secondary | ICD-10-CM | POA: Diagnosis not present

## 2024-04-05 DIAGNOSIS — M150 Primary generalized (osteo)arthritis: Secondary | ICD-10-CM | POA: Diagnosis not present

## 2024-04-05 DIAGNOSIS — E782 Mixed hyperlipidemia: Secondary | ICD-10-CM | POA: Diagnosis not present

## 2024-04-05 DIAGNOSIS — I700 Atherosclerosis of aorta: Secondary | ICD-10-CM | POA: Diagnosis not present

## 2024-04-07 ENCOUNTER — Other Ambulatory Visit: Payer: Self-pay | Admitting: Cardiovascular Disease

## 2024-04-07 DIAGNOSIS — I440 Atrioventricular block, first degree: Secondary | ICD-10-CM | POA: Diagnosis not present

## 2024-04-07 DIAGNOSIS — I2510 Atherosclerotic heart disease of native coronary artery without angina pectoris: Secondary | ICD-10-CM | POA: Diagnosis not present

## 2024-04-07 DIAGNOSIS — R972 Elevated prostate specific antigen [PSA]: Secondary | ICD-10-CM | POA: Diagnosis not present

## 2024-04-07 DIAGNOSIS — I700 Atherosclerosis of aorta: Secondary | ICD-10-CM | POA: Diagnosis not present

## 2024-04-07 DIAGNOSIS — G250 Essential tremor: Secondary | ICD-10-CM | POA: Diagnosis not present

## 2024-04-07 DIAGNOSIS — N400 Enlarged prostate without lower urinary tract symptoms: Secondary | ICD-10-CM | POA: Diagnosis not present

## 2024-04-07 DIAGNOSIS — I10 Essential (primary) hypertension: Secondary | ICD-10-CM | POA: Diagnosis not present

## 2024-04-07 DIAGNOSIS — E782 Mixed hyperlipidemia: Secondary | ICD-10-CM | POA: Diagnosis not present

## 2024-04-07 DIAGNOSIS — F419 Anxiety disorder, unspecified: Secondary | ICD-10-CM | POA: Diagnosis not present

## 2024-04-07 DIAGNOSIS — R911 Solitary pulmonary nodule: Secondary | ICD-10-CM | POA: Diagnosis not present

## 2024-04-07 DIAGNOSIS — G20A1 Parkinson's disease without dyskinesia, without mention of fluctuations: Secondary | ICD-10-CM | POA: Diagnosis not present

## 2024-04-07 DIAGNOSIS — Z0000 Encounter for general adult medical examination without abnormal findings: Secondary | ICD-10-CM | POA: Diagnosis not present

## 2024-04-10 ENCOUNTER — Other Ambulatory Visit: Payer: Self-pay | Admitting: Family Medicine

## 2024-04-10 DIAGNOSIS — H532 Diplopia: Secondary | ICD-10-CM

## 2024-04-15 DIAGNOSIS — R609 Edema, unspecified: Secondary | ICD-10-CM | POA: Diagnosis not present

## 2024-04-23 DIAGNOSIS — M75101 Unspecified rotator cuff tear or rupture of right shoulder, not specified as traumatic: Secondary | ICD-10-CM | POA: Diagnosis not present

## 2024-05-02 DIAGNOSIS — L570 Actinic keratosis: Secondary | ICD-10-CM | POA: Diagnosis not present

## 2024-05-05 DIAGNOSIS — E782 Mixed hyperlipidemia: Secondary | ICD-10-CM | POA: Diagnosis not present

## 2024-05-05 DIAGNOSIS — I2510 Atherosclerotic heart disease of native coronary artery without angina pectoris: Secondary | ICD-10-CM | POA: Diagnosis not present

## 2024-05-05 DIAGNOSIS — M150 Primary generalized (osteo)arthritis: Secondary | ICD-10-CM | POA: Diagnosis not present

## 2024-05-05 DIAGNOSIS — E669 Obesity, unspecified: Secondary | ICD-10-CM | POA: Diagnosis not present

## 2024-05-11 ENCOUNTER — Ambulatory Visit
Admission: RE | Admit: 2024-05-11 | Discharge: 2024-05-11 | Disposition: A | Discharge status: Home / Self Care | Source: Ambulatory Visit | Attending: Family Medicine | Admitting: Family Medicine

## 2024-05-11 DIAGNOSIS — H539 Unspecified visual disturbance: Secondary | ICD-10-CM | POA: Diagnosis not present

## 2024-05-11 DIAGNOSIS — H532 Diplopia: Secondary | ICD-10-CM

## 2024-05-11 MED ORDER — GADOPICLENOL 0.5 MMOL/ML IV SOLN
10.0000 mL | Freq: Once | INTRAVENOUS | Status: AC | PRN
  Administered 2024-05-11: 10 mL via INTRAVENOUS

## 2024-06-16 DIAGNOSIS — H0288B Meibomian gland dysfunction left eye, upper and lower eyelids: Secondary | ICD-10-CM | POA: Diagnosis not present

## 2024-06-16 DIAGNOSIS — Z961 Presence of intraocular lens: Secondary | ICD-10-CM | POA: Diagnosis not present

## 2024-06-16 DIAGNOSIS — H35373 Puckering of macula, bilateral: Secondary | ICD-10-CM | POA: Diagnosis not present

## 2024-06-16 DIAGNOSIS — H353131 Nonexudative age-related macular degeneration, bilateral, early dry stage: Secondary | ICD-10-CM | POA: Diagnosis not present

## 2024-06-16 DIAGNOSIS — H04123 Dry eye syndrome of bilateral lacrimal glands: Secondary | ICD-10-CM | POA: Diagnosis not present

## 2024-07-17 ENCOUNTER — Other Ambulatory Visit: Payer: Self-pay | Admitting: Adult Health

## 2024-07-17 ENCOUNTER — Other Ambulatory Visit: Payer: Self-pay | Admitting: Cardiovascular Disease

## 2024-07-17 ENCOUNTER — Encounter: Payer: Self-pay | Admitting: Cardiovascular Disease

## 2024-08-14 ENCOUNTER — Other Ambulatory Visit: Payer: Self-pay | Admitting: Cardiovascular Disease

## 2024-08-14 ENCOUNTER — Other Ambulatory Visit: Payer: Self-pay | Admitting: Adult Health

## 2024-08-18 ENCOUNTER — Telehealth: Payer: Self-pay | Admitting: Cardiovascular Disease

## 2024-08-18 NOTE — Telephone Encounter (Signed)
 Pt c/o Shortness Of Breath: STAT if SOB developed within the last 24 hours or pt is noticeably SOB on the phone  1. Are you currently SOB (can you hear that pt is SOB on the phone)? No, spoke with patient's wife  2. How long have you been experiencing SOB? Patient's wife noticed he has been sob for awhile now  3. Are you SOB when sitting or when up moving around? Moving around  4. Are you currently experiencing any other symptoms? Jaw pain/tightness   Patient's wife called to schedule a year follow up appointment, but mentioned the patient had pain in his jaw last night. Patient's wife has noticed he gets sob when he is moving around. Please advise.

## 2024-08-21 ENCOUNTER — Ambulatory Visit: Admitting: Physician Assistant

## 2024-09-09 NOTE — Progress Notes (Unsigned)
 erro

## 2024-09-10 NOTE — Progress Notes (Unsigned)
 Cardiology Office Note:    Date:  09/11/2024   ID:  Darren Nixon, DOB 1946-07-02, MRN [id number]  PCP:  [name], MD   [facility] Providers Cardiologist:  Darryle ONEIDA Decent, MD     Referring MD: [name], MD   Chief complaint: Annual follow up of CAD     History of Present Illness:   Darren Nixon is a 78 y.o. male with a hx of nonobstructive CAD, HLD, OSA on CPAP, parkinson's disease, first degree AV block, aortic atherosclerosis, HTN, presenting for follow-up of CAD and recent SOB.  Seen by our cardiology services March 2022 for evaluation for atypical chest pain/abnormal EKG. Coronary CTA 01/19/2021 showed coronary calcium  score 39, (27th percentile), mild CAD (25-49%) in the mid LAD, minimal CAD (less than 25%) in the proximal LCx, mild-moderate CAD in the RCA (40-50%).  Submitted for FFR which was positive for disease in the distal LAD, mid RCA borderline positive. Echo 02/07/2021 showed LVEF 60-65%, normal LV function, no RWMA, normal RV, mild MV regurg, mild-moderate aortic valve sclerosis present without evidence of stenosis, RA pressure 8 mmHg. Medical management was pursued.  Advised to continue metoprolol  to tartrate 25 mg twice daily.  OV 06/26/2023 patient complained of jaw pain while working in his yard.  EKG in office showed sinus bradycardia at 49 bpm, and stress testing was ordered.  Exercise Myoview  performed on 07/06/2023 showing no ST deviation, no evidence of ischemia, no evidence of infarction, normal/low risk study.  Presents with his wife to clinic, appears stable from a cardiovascular standpoint. He denies chest pain, palpitations, dyspnea, orthopnea, n, v, dark/tarry/bloody stools, hematuria,  syncope, edema, weight gain. Reports occasional jaw pain with bending over while working outside, unchanged from years prior, resolves with positional change, no associated symptoms. Attends bi-weekly high intensity exercise program lasting 1.5 hours per  session without jaw pain. Wears his CPAP nightly. BPs averaging in the 150s systolic daily at home. Denies nitroglycerin  use. 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   ROS:   Please see the history of present illness.    All other systems reviewed and are negative.     Past Medical History:  Diagnosis Date   Coronary artery disease    Hyperlipidemia     History reviewed. No pertinent surgical history.  Current Medications: Current Meds  Medication Sig   ALPRAZolam (XANAX) 0.25 MG tablet Take 0.25 mg by mouth at bedtime as needed for anxiety.   amLODipine (NORVASC) 5 MG tablet Take 1 tablet (5 mg total) by mouth daily.   aspirin EC 81 MG tablet Take 81 mg by mouth daily. Swallow whole.   famciclovir (FAMVIR) 500 MG tablet Take 500 mg by mouth 2 (two) times daily. Take 2 tablets by mouth twice daily for fever blister as needed.   Krill Oil (OMEGA-3) 500 MG CAPS Take 500 mg by mouth daily.   meloxicam (MOBIC) 15 MG tablet Take 15 mg by mouth daily.   Multiple Vitamins-Minerals (EYE VITAMINS) CAPS Take 1 capsule by mouth daily at 6 (six) AM.   nitroGLYCERIN  (NITROSTAT ) 0.4 MG SL tablet Place 1 tablet (0.4 mg total) under the tongue every 5 (five) minutes as needed for chest pain.   Probiotic Product (PROBIOTIC ADVANCED PO) Take by mouth.   tadalafil (CIALIS) 5 MG tablet Take 5 mg by mouth daily as needed for erectile dysfunction.   tamsulosin (FLOMAX) 0.4 MG CAPS capsule Take  0.4 mg by mouth daily.   valACYclovir HCl (VALTREX PO) Take 15 mg by mouth as needed.   [DISCONTINUED] atorvastatin  (LIPITOR) 80 MG tablet Take 1 tablet by mouth once daily   [DISCONTINUED] ezetimibe  (ZETIA ) 10 MG tablet Take 1 tablet by mouth once daily   [DISCONTINUED] fluticasone (FLONASE) 50 MCG/ACT nasal spray Place into both nostrils daily.   [DISCONTINUED] metoprolol  tartrate (LOPRESSOR ) 25 MG tablet Take 25 mg by mouth daily at 6 (six)  AM.   [DISCONTINUED] metoprolol  tartrate (LOPRESSOR ) 25 MG tablet TAKE 1 TABLET BY MOUTH TWICE DAILY . APPOINTMENT REQUIRED FOR FUTURE REFILLS   [DISCONTINUED] Omega-3 Fatty Acids (FISH OIL) 1000 MG CAPS Take by mouth.     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  Tobacco Use   Smoking status: Former    Current packs/day: 0.00    Average packs/day: 1 pack/day for 20.0 years (20.0 ttl pk-yrs)    Types: Cigarettes    Start date: 1966    Quit date: 1986    Years since quitting: 39.8   Smokeless tobacco: Never  Substance and Sexual Activity   Alcohol use: Yes   Drug use: Never   Sexual activity: Not on file  Other Topics Concern   Not on file  Social History Narrative   Not on file   Social Drivers of Health   Financial Resource Strain: Not on file  Food Insecurity: Not on file  Transportation Needs: Not on file  Physical Activity: Not on file  Stress: Not on file  Social Connections: Not on file     Family History: The patient's family history includes Heart attack in his father; Heart failure in his father; Kidney disease in his brother.  EKGs/Labs/Other Studies Reviewed:    The following studies were reviewed today:  EKG Interpretation Date/Time:  Thursday September 11 2024 08:21:02 EST Ventricular Rate:  55 PR Interval:  226 QRS Duration:  86 QT Interval:  420 QTC Calculation: 401 R Axis:   73  Text Interpretation: Sinus bradycardia with 1st degree A-V block When compared with ECG of 25-Jun-2023 15:09, No significant change was found Confirmed by Torien Ramroop [phone]) on 09/11/2024 8:24:52 AM    Recent Labs: No results found for requested labs within last 365 days.  Recent Lipid  Panel    Component Value Date/Time   CHOL 150 04/12/2021 0912   TRIG 105 04/12/2021 0912   HDL 59 04/12/2021 0912   CHOLHDL 2.5 04/12/2021 0912   LDLCALC 72 04/12/2021 0912     Lab:CBC with Diff (Order Date - 04/03/2024) (Collection Date & Time - 04/03/2024 03:05 PM)           Value Reference Range          WBC 6.8   4.0-11.0 - K/ul          RBC 4.33   4.20-5.80 - M/uL          HGB 14.0   13.0-17.0 - g/dL          HCT 59.1   60.9-47.9 - %          MCV 94.3 H 80.0-94.0 - fL  MCH 32.4   27.0-33.0 - pg          MCHC 34.3   32.0-36.0 - g/dL          RDW 86.8   88.4-84.4 - %          PLT 269   150-400 - K/uL          MPV 7.7   7.5-10.7 - fL          NRBC# 0.01   -          NEUT % 63.0   43.3-71.9 - %          NRBC% 0.20   - %          LYMPH% 23.1   16.8-43.5 - %          MONO % 11.0   4.6-12.4 - %          EOS % 2.5   0.0-7.8 - %          BASO % 0.4   0.0-1.0 - %          NEUT # 4.3   1.9-7.2 - K/uL          LYMPH# 1.60   1.10-2.70 - K/uL          MONO # 0.8   0.3-0.8 - K/uL          EOS # 0.2   0.0-0.6 - K/uL          BASO # 0.0   0.0-0.1 - K/uL          Notes: German Pickerel 04/03/2024 03:06:31 PM > Portal, call if abnormal HAMMER, ELI 04/04/2024 07:56:30 AM > Labs to be discussed at visit. CBC normal. CMP normal. Lipids well-controlled      Lab: Comp Metabolic Panel (Collection Date & Time - 04/15/2024 04:32 PM)               GLUCOSE  102 H 70-99 - mg/dL      BUN  21   3-73 - mg/dL      CREATININE 9.10   0.60-1.30 - mg/dl      eGFR  89   >39 - calc      SODIUM  142   136-145 - mmol/L      POTASSIUM 4.9   3.5-5.5 - mmol/L      CHLORIDE 107   98-107 - mmol/L      C02 30   22-32 - mmol/L      ANION GAP 10.0   6.0-20.0 - mmol/L      CALCIUM   9.4   8.6-10.3 - mg/dL      CA-Alb corrected 0.86   8.60-10.30 - mg/dL      T PROTEIN 6.7   3.9-1.6 - g/dL      ALBUMIN  4.3   6.5-5.1 - g/dL      T.BILI  0.6   0.3-1.0 - mg/dL      ALP  50   61-873 - U/L      AST  23   0-39  - U/L      ALT   35   0-52 - U/L   Lab: Lipid Panel w/reflex (Ordered for 04/03/2024) (Collection Date & Time - 04/03/2024 03:05)      Cholesterol  128   <200 - mg/dL      TRIG  867   9-800 - mg/dL      HDLD  52  30-70 - mg/dL      Calc LDL  53   9-00 - mg/dL      Non-HDL  76   9-870 - mg/dL  Chol/HDL ratio 2.4   7.9-5.9 - Ratio   Risk Assessment/Calculations:      HYPERTENSION CONTROL Vitals:   09/11/24 0829 09/11/24 0900  BP: (!) 152/64 (!) 146/68    The patient's blood pressure is elevated above target today.  In order to address the patient's elevated BP: A current anti-hypertensive medication was adjusted today.            Physical Exam:    VS:  BP (!) 146/68   Pulse (!) 55   Ht 6' 1 (1.854 m)   Wt 245 lb (111.1 kg)   SpO2 97%   BMI 32.32 kg/m        Wt Readings from Last 3 Encounters:  09/11/24 245 lb (111.1 kg)  07/30/23 238 lb 12.8 oz (108.3 kg)  07/06/23 238 lb (108 kg)     GEN:  Well nourished, well developed in no acute distress HEENT: Normal NECK: No carotid bruits CARDIAC:  S1-S2 normal, RRR, no murmurs, rubs, gallops RESPIRATORY:  Clear to auscultation without rales, wheezing or rhonchi  MUSCULOSKELETAL:  No edema; No deformity  SKIN: Warm and dry NEUROLOGIC:  Alert and oriented x 3 PSYCHIATRIC:  Normal affect       Assessment & Plan Sinus bradycardia EKG: Sinus bradycardia with 1st degree A-V block, 55 bpm During visit, wearable monitor showing HR 49 Will stop lopressor  25 mg daily given lack of anginal symptoms, bradycardia, as he was only taking this once daily, most recent dose last night.  Coronary artery disease involving native coronary artery of native heart, unspecified whether angina present Coronary CTA 01/19/2021 showed coronary calcium  score 39, (27th percentile), mild CAD (25-49%) in the mid LAD, minimal CAD (less than 25%) in the proximal LCx, mild-moderate CAD in the RCA (40-50%).  Submitted for FFR which showed  obstructive disease in the distal LAD, mid RCA borderline positive. EKG: Sinus bradycardia with 1st degree A-V block, 55 bpm Occasional jaw pain with positional change does not appear anginal in nature, resolves with positional correction, not occurring during high intensity exercise program.  Denies CP, SOB, palpitations, near syncope Continue aspirin EC 81 mg daily Continue atorvastatin  80 mg daily Continue nitroglycerin  0.4 mg SL tab PRN chest pain every 5 minutes Stop lopressor  25 mg  States the [facility] is ordering an echo on him for workup for prior exposure to agent orange, but he's not completely sure that's why they're ordering it. States he will bring a copy of the results to his next visit.  Primary hypertension BPs averaging 150s systolic at home He typically checks it in the afternoon while watching TV Advised to check BP more regularly and keep a log to bring to next visit Stop lopressor  Start amlodipine 5 mg daily Hypercholesterolemia Managed by PCP LDL 53 in May 2025 as above Continue atorvastatin  80 mg daily Continue zetia  10 mg daily OSA (obstructive sleep apnea) Reports regular nightly use of CPAP   Follow up in one month with MD or APP      Medication Adjustments/Labs and Tests Ordered: Current medicines are reviewed at length with the patient today.  Concerns regarding medicines are outlined above.  Orders Placed This Encounter  Procedures   EKG 12-Lead   Meds ordered this encounter  Medications   atorvastatin  (LIPITOR) 80 MG tablet    Sig:  Take 1 tablet (80 mg total) by mouth daily.    Dispense:  90 tablet    Refill:  0    .fut   ezetimibe  (ZETIA ) 10 MG tablet    Sig: Take 1 tablet (10 mg total) by mouth daily.    Dispense:  30 tablet    Refill:  0    Please schedule overdue yearly appt for future refills [phone]. Thank you 1st attempt   amLODipine (NORVASC) 5 MG tablet    Sig: Take 1 tablet (5 mg total) by mouth daily.    Dispense:  90 tablet     Refill:  3    Patient Instructions  Medication Instructions:  Stop Metoprolol   Start Amlodipine 5 mg take one tablet daily *If you need a refill on your cardiac medications before your next appointment, please call your pharmacy*  Lab Work: None ordered If you have labs (blood work) drawn today and your tests are completely normal, you will receive your results only by: MyChart Message (if you have MyChart) OR A paper copy in the mail If you have any lab test that is abnormal or we need to change your treatment, we will call you to review the results.  Testing/Procedures: None ordered  Follow-Up: At [facility], you and your health needs are our priority.  As part of our continuing mission to provide you with exceptional heart care, our providers are all part of one team.  This team includes your primary Cardiologist (physician) and Advanced Practice Providers or APPs (Physician Assistants and Nurse Practitioners) who all work together to provide you with the care you need, when you need it.  Your next appointment:   1 month(s)  Provider:   Miriam Shams, NP          We recommend signing up for the patient portal called MyChart.  Sign up information is provided on this After Visit Summary.  MyChart is used to connect with patients for Virtual Visits (Telemedicine).  Patients are able to view lab/test results, encounter notes, upcoming appointments, etc.  Non-urgent messages can be sent to your provider as well.   To learn more about what you can do with MyChart, go to [email or web address].   Other Instructions Please send us  your echo results. You can fax them to [phone], send through MyChart, or bring them to our office           Signed, Miriam FORBES Shams, NP  09/11/2024 10:48 AM    [facility]

## 2024-09-11 ENCOUNTER — Encounter: Payer: Self-pay | Admitting: Physician Assistant

## 2024-09-11 ENCOUNTER — Ambulatory Visit: Attending: Physician Assistant | Admitting: Emergency Medicine

## 2024-09-11 VITALS — BP 146/68 | HR 55 | Ht 73.0 in | Wt 245.0 lb

## 2024-09-11 DIAGNOSIS — E7800 Pure hypercholesterolemia, unspecified: Secondary | ICD-10-CM | POA: Insufficient documentation

## 2024-09-11 DIAGNOSIS — I10 Essential (primary) hypertension: Secondary | ICD-10-CM | POA: Diagnosis not present

## 2024-09-11 DIAGNOSIS — I2510 Atherosclerotic heart disease of native coronary artery without angina pectoris: Secondary | ICD-10-CM | POA: Insufficient documentation

## 2024-09-11 DIAGNOSIS — R001 Bradycardia, unspecified: Secondary | ICD-10-CM | POA: Insufficient documentation

## 2024-09-11 DIAGNOSIS — G4733 Obstructive sleep apnea (adult) (pediatric): Secondary | ICD-10-CM | POA: Diagnosis not present

## 2024-09-11 MED ORDER — ATORVASTATIN CALCIUM 80 MG PO TABS: 80.0000 mg | ORAL_TABLET | Freq: Every day | ORAL | 0 refills | Status: AC

## 2024-09-11 MED ORDER — EZETIMIBE 10 MG PO TABS: 10.0000 mg | ORAL_TABLET | Freq: Every day | ORAL | 0 refills | Status: DC

## 2024-09-11 MED ORDER — AMLODIPINE BESYLATE 5 MG PO TABS: 5.0000 mg | ORAL_TABLET | Freq: Every day | ORAL | 3 refills | Status: DC

## 2024-09-11 NOTE — Patient Instructions (Addendum)
 Medication Instructions:  Stop Metoprolol   Start Amlodipine 5 mg take one tablet daily *If you need a refill on your cardiac medications before your next appointment, please call your pharmacy*  Lab Work: None ordered If you have labs (blood work) drawn today and your tests are completely normal, you will receive your results only by: MyChart Message (if you have MyChart) OR A paper copy in the mail If you have any lab test that is abnormal or we need to change your treatment, we will call you to review the results.  Testing/Procedures: None ordered  Follow-Up: At [facility], you and your health needs are our priority.  As part of our continuing mission to provide you with exceptional heart care, our providers are all part of one team.  This team includes your primary Cardiologist (physician) and Advanced Practice Providers or APPs (Physician Assistants and Nurse Practitioners) who all work together to provide you with the care you need, when you need it.  Your next appointment:   1 month(s)  Provider:   Miriam Shams, NP          We recommend signing up for the patient portal called MyChart.  Sign up information is provided on this After Visit Summary.  MyChart is used to connect with patients for Virtual Visits (Telemedicine).  Patients are able to view lab/test results, encounter notes, upcoming appointments, etc.  Non-urgent messages can be sent to your provider as well.   To learn more about what you can do with MyChart, go to [email or web address].   Other Instructions Please send us  your echo results. You can fax them to [phone], send through MyChart, or bring them to our office

## 2024-10-28 NOTE — Progress Notes (Signed)
 " Cardiology Office Note:    Date:  11/04/2024   ID:  Darren Nixon, DOB 20-Nov-1945, MRN [id number]  PCP:  [name], MD   [facility] Providers Cardiologist:  Darryle ONEIDA Decent, MD     Referring MD: [name], MD   Chief Complaint  Patient presents with   Follow-up    HTN    History of Present Illness:    Darren Nixon is a 78 y.o. male with a hx of nonobstructive CAD, HLD, OSA on CPAP, obesity, parkinson's disease, aortic atherosclerosis, and HTN.  Coronary CTA 04/2021 showed obstructive CAD in the distal LAD, medical management was recommended.  When last seen by NP [name] 06/2023, he reported jaw pain when working in the yard.  Nuclear stress test was obtained 07/06/2023 and was negative for ischemia. EKG with SB.   Follow up visit 09/11/2024 he was hypertensive with home SBP 150s. SB in the 40s. Lopressor  was discontinued. Amlodipine  was started for hypertension.   He presents back for BP check. SBP remains in the 130s.   He attends [employer] on [address] that is paid for to increase PT ([facility]). He attends twice weekly. No jaw pain with exercising at the gym, still occasionally gets jaw pain when active outside not at the gym. Unclear improvement with amlodipine .   He recently had a heart monitor through the [facility] after he stopped lopressor . Heart monitor continued to show sinus bradycardia off of lopressor  ([facility] read below). He also had a repeat echo at the [facility], I do not have these records.    Past Medical History:  Diagnosis Date   Coronary artery disease    Hyperlipidemia     History reviewed. No pertinent surgical history.  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  Tobacco Use   Smoking status: Former    Current packs/day: 0.00    Average packs/day: 1 pack/day for 20.0 years (20.0 ttl pk-yrs)    Types: Cigarettes    Start date: 1966    Quit date: 1986    Years since quitting: 40.0   Smokeless tobacco: Never  Substance and Sexual Activity   Alcohol use: Yes   Drug use: Never   Sexual activity: Not on file  Other Topics Concern   Not on file  Social History Narrative   Not on file   Social Drivers of Health   Tobacco Use: Medium Risk (11/04/2024)   Patient History    Smoking Tobacco Use: Former    Smokeless Tobacco Use: Never    Passive Exposure: Not on Actuary Strain: Not on file  Food Insecurity: Not on file  Transportation Needs: Not on file  Physical Activity: Not on file  Stress: Not on file  Social Connections: Not on file  Depression (EYV7-0): Not on file  Alcohol Screen: Not on file  Housing: Not on file  Utilities: Not on file  Health Literacy: Not on file     Family History: The patient's family history includes Heart attack in his father; Heart failure in his father; Kidney disease in his brother.  ROS:   Please  see the history of present illness.     All other systems reviewed and are negative.  EKGs/Labs/Other Studies Reviewed:    The following studies were reviewed today:  Heart monitor 09/12/2024 at the [facility]: Patient had a minimum heart rate of 38 bpm, max heart rate of 133 bpm, average heart rate of 55 bpm.  Predominant underlying rhythm was sinus rhythm.  First-degree AV block was present.  2 SVT runs occurred, the run with the fastest interval lasting 21.8 seconds with a max rate of 105 bpm; the run with the fastest interval was also the longest.  Isolated SVE's were rare (less than 1%), SVE couplets were rare (less than 1%).  Isolated VE's were rare and no VE couplets or VE triplets were present.  There were 9 patient events.  Sinus mechanism  without ectopy was observed on most of the activations.  No pauses, AV block, or A-fib.         Recent Labs: No results found for requested labs within last 365 days.  Recent Lipid Panel    Component Value Date/Time   CHOL 150 04/12/2021 0912   TRIG 105 04/12/2021 0912   HDL 59 04/12/2021 0912   CHOLHDL 2.5 04/12/2021 0912   LDLCALC 72 04/12/2021 0912     Risk Assessment/Calculations:                Physical Exam:    VS:  BP 138/68   Pulse (!) 56   Ht 6' 1 (1.854 m)   Wt 245 lb (111.1 kg)   SpO2 98%   BMI 32.32 kg/m     Wt Readings from Last 3 Encounters:  11/04/24 245 lb (111.1 kg)  09/11/24 245 lb (111.1 kg)  07/30/23 238 lb 12.8 oz (108.3 kg)     GEN:  Well nourished, well developed in no acute distress HEENT: Normal NECK: No JVD; No carotid bruits LYMPHATICS: No lymphadenopathy CARDIAC: RRR, no murmurs, rubs, gallops RESPIRATORY:  Clear to auscultation without rales, wheezing or rhonchi  ABDOMEN: Soft, non-tender, non-distended MUSCULOSKELETAL:  No edema; No deformity  SKIN: Warm and dry NEUROLOGIC:  Alert and oriented x 3 PSYCHIATRIC:  Normal affect   ASSESSMENT:    1. Primary hypertension   2. Coronary artery disease involving native coronary artery of native heart, unspecified whether angina present   3. Hyperlipidemia with target LDL less than 70   4. OSA (obstructive sleep apnea)   5. Overweight    PLAN:    In order of problems listed above:  Hypertension - stopped lopressor  and started 5 mg amlodipine  at last visit - BP remains in the upper 130s on BP log and here - will attempt to increase amlodipine  to 7.5 mg nightly - he will restart BP log. If he feels dizzy or unsteady (Parkinson's), he will go back to the 5 mg tablet.    CAD - CAD by CT coronary 04/2021: Obstructive CAD in the distal LAD - Nuclear stress test 07/06/2023 showed no ischemia -Continued secondary prevention -Is taking 81 mg aspirin, statin, Zetia  -- given SB,  stopped BB (25 mg lopressor   BID)   Hyperlipidemia with LDL goal less than 70 -Continue 80 mg Lipitor, 10 mg Zetia , on OTC Krill oil - lipid panel 03/2024 with Total chol 128 HDL 52 LDL 53 Trig 132   Mild obesity - Working on weight loss   OSA on CPAP - Compliant   Will make a 4 week follow up appt which can be canceled if  he is doing well. I also made a 3 month appt with Dr. Barbaraann.       Medication Adjustments/Labs and Tests Ordered: Current medicines are reviewed at length with the patient today.  Concerns regarding medicines are outlined above.  No orders of the defined types were placed in this encounter.  Meds ordered this encounter  Medications   amLODipine  (NORVASC ) 5 MG tablet    Sig: Take 1.5 tablets (7.5 mg total) by mouth at bedtime.    Dispense:  90 tablet    Refill:  3    Patient Instructions  Medication Instructions:  INCREASE Amlodipine  (Norvasc ) to 7.5 mg. Take one and one-half (1.5) tablet by mouth at night.  *If you need a refill on your cardiac medications before your next appointment, please call your pharmacy*  Lab Work: None ordered If you have labs (blood work) drawn today and your tests are completely normal, you will receive your results only by: MyChart Message (if you have MyChart) OR A paper copy in the mail If you have any lab test that is abnormal or we need to change your treatment, we will call you to review the results.  Testing/Procedures: None ordered  Follow-Up: At [facility], you and your health needs are our priority.  As part of our continuing mission to provide you with exceptional heart care, our providers are all part of one team.  This team includes your primary Cardiologist (physician) and Advanced Practice Providers or APPs (Physician Assistants and Nurse Practitioners) who all work together to provide you with the care you need, when you need it.  Your next appointment:   3 month(s)  Provider:   Darryle ONEIDA Barbaraann, MD   6 weeks with Rollo Louder PA-C. Can cancel if BP is controlled.    We recommend signing up for the patient portal called MyChart.  Sign up information is provided on this After Visit Summary.  MyChart is used to connect with patients for Virtual Visits (Telemedicine).  Patients are able to view lab/test results, encounter notes, upcoming appointments, etc.  Non-urgent messages can be sent to your provider as well.   To learn more about what you can do with MyChart, go to [email or web address].   Other Instructions Please fax your most recent Echocardiogram from the [facility] to [phone].            Signed, Jon Nat Hails, PA  11/04/2024 10:22 AM    [facility]     [1]  Current Meds  Medication Sig   ALPRAZolam (XANAX) 0.25 MG tablet Take 0.25 mg by mouth at bedtime as needed for anxiety.   aspirin EC 81 MG tablet Take 81 mg by mouth daily. Swallow whole.   atorvastatin  (LIPITOR) 80 MG tablet Take 1 tablet (80 mg total) by mouth daily.   ezetimibe  (ZETIA ) 10 MG tablet Take 1 tablet (10 mg total) by mouth daily.   famciclovir (FAMVIR) 500 MG tablet Take 500 mg by mouth 2 (two) times daily. Take 2 tablets by mouth twice daily for fever blister as needed.   Krill Oil (OMEGA-3) 500 MG CAPS Take 500 mg by mouth daily.   meloxicam (MOBIC) 15 MG tablet Take 15 mg by mouth daily.   Multiple Vitamins-Minerals (EYE VITAMINS) CAPS Take 1 capsule by mouth daily at 6 (six) AM.   nitroGLYCERIN  (NITROSTAT ) 0.4 MG SL tablet Place 1 tablet (0.4 mg total) under the tongue every 5 (five) minutes as needed for chest pain.  primidone (MYSOLINE) 50 MG tablet Take 150 mg by mouth.   Probiotic Product (PROBIOTIC ADVANCED PO) Take by mouth.   tadalafil (CIALIS) 5 MG tablet Take 5 mg by mouth daily as needed for erectile dysfunction.   tamsulosin (FLOMAX) 0.4 MG CAPS capsule Take 0.4 mg by mouth daily.   valACYclovir HCl (VALTREX PO) Take 15 mg by mouth as needed.    [DISCONTINUED] amLODipine  (NORVASC ) 5 MG tablet Take 1 tablet (5 mg total) by mouth daily.   "

## 2024-11-04 ENCOUNTER — Encounter: Payer: Self-pay | Admitting: Physician Assistant

## 2024-11-04 ENCOUNTER — Ambulatory Visit: Attending: Physician Assistant | Admitting: Physician Assistant

## 2024-11-04 VITALS — BP 138/68 | HR 56 | Ht 73.0 in | Wt 245.0 lb

## 2024-11-04 DIAGNOSIS — G4733 Obstructive sleep apnea (adult) (pediatric): Secondary | ICD-10-CM | POA: Diagnosis not present

## 2024-11-04 DIAGNOSIS — I10 Essential (primary) hypertension: Secondary | ICD-10-CM | POA: Diagnosis not present

## 2024-11-04 DIAGNOSIS — I2510 Atherosclerotic heart disease of native coronary artery without angina pectoris: Secondary | ICD-10-CM | POA: Insufficient documentation

## 2024-11-04 DIAGNOSIS — E663 Overweight: Secondary | ICD-10-CM | POA: Insufficient documentation

## 2024-11-04 DIAGNOSIS — E785 Hyperlipidemia, unspecified: Secondary | ICD-10-CM | POA: Diagnosis not present

## 2024-11-04 MED ORDER — AMLODIPINE BESYLATE 5 MG PO TABS: 7.5000 mg | ORAL_TABLET | Freq: Every evening | ORAL | 3 refills | Status: AC

## 2024-11-04 NOTE — Patient Instructions (Signed)
 Medication Instructions:  INCREASE Amlodipine  (Norvasc ) to 7.5 mg. Take one and one-half (1.5) tablet by mouth at night.  *If you need a refill on your cardiac medications before your next appointment, please call your pharmacy*  Lab Work: None ordered If you have labs (blood work) drawn today and your tests are completely normal, you will receive your results only by: MyChart Message (if you have MyChart) OR A paper copy in the mail If you have any lab test that is abnormal or we need to change your treatment, we will call you to review the results.  Testing/Procedures: None ordered  Follow-Up: At [facility], you and your health needs are our priority.  As part of our continuing mission to provide you with exceptional heart care, our providers are all part of one team.  This team includes your primary Cardiologist (physician) and Advanced Practice Providers or APPs (Physician Assistants and Nurse Practitioners) who all work together to provide you with the care you need, when you need it.  Your next appointment:   3 month(s)  Provider:   Darryle ONEIDA Decent, MD   6 weeks with Rollo Louder PA-C. Can cancel if BP is controlled.    We recommend signing up for the patient portal called MyChart.  Sign up information is provided on this After Visit Summary.  MyChart is used to connect with patients for Virtual Visits (Telemedicine).  Patients are able to view lab/test results, encounter notes, upcoming appointments, etc.  Non-urgent messages can be sent to your provider as well.   To learn more about what you can do with MyChart, go to [email or web address].   Other Instructions Please fax your most recent Echocardiogram from the [facility] to [phone].

## 2024-11-14 ENCOUNTER — Other Ambulatory Visit: Payer: Self-pay | Admitting: Emergency Medicine

## 2024-11-17 NOTE — Progress Notes (Unsigned)
" °  Cardiology Office Note   Date:  11/17/2024  ID:  ESTER MABE, DOB Apr 04, 1946, MRN [id number] PCP: [name], MD  [facility] Providers Cardiologist:  Darryle ONEIDA Decent, MD    History of Present Illness AAIDEN DEPOY is a 79 y.o. male with a past medical history of nonobstructive CAD, HLD, OSA on CPAP, obesity, Parkinson's disease, and HTN. Presents today for BP check   Patient previously had a coronary CTA in 01/2021 that showed a coronary calcium  score of 39 (27th percentile), obstructive CAD in the distal LAD that was managed medically. Echocardiogram in 02/2021 showed EF 60-65%, no regional wall motion abnormalities, normal RV systolic function, mild MR.  Later, stress test in 06/2023 was a normal, low risk study.   Patient seen in 09/2024. He was hypertensive and bradycardic. Lopressor  discontinued and amlodipine  started. BP remained elevated at follow up on 12/30. Amlodipine  increased to 7.5 mg nightly   HTN  -  - Continue amlodipine  7.5 mg nightly   CAD  - Has known history of CAD by CT coronary 04/2021: Obstructive CAD in the distal LAD - Nuclear stress test 07/06/2023 showed no ischemia -  - Continue ASA 81 mg daily - Continue amlodipine  7.5 mg nightly. Not on BB due to history of bradycardia  - Continue lipitor 80 mg daily   OSA on CPAP   ROS: ***  Studies Reviewed      *** Risk Assessment/Calculations {Does this patient have ATRIAL FIBRILLATION?[phone]} No BP recorded.  {Refresh Note OR Click here to enter BP  :1}***       Physical Exam VS:  There were no vitals taken for this visit.       Wt Readings from Last 3 Encounters:  11/04/24 245 lb (111.1 kg)  09/11/24 245 lb (111.1 kg)  07/30/23 238 lb 12.8 oz (108.3 kg)    GEN: Well nourished, well developed in no acute distress NECK: No JVD; No carotid bruits CARDIAC: ***RRR, no murmurs, rubs, gallops RESPIRATORY:  Clear to auscultation without rales, wheezing or rhonchi  ABDOMEN: Soft,  non-tender, non-distended EXTREMITIES:  No edema; No deformity   ASSESSMENT AND PLAN ***    {Are you ordering a CV Procedure (e.g. stress test, cath, DCCV, TEE, etc)?   Press F2        :[id number]}  Dispo: ***  Signed, Rollo FABIENE Louder, PA-C   "

## 2024-11-24 ENCOUNTER — Encounter: Payer: Self-pay | Admitting: Neurology

## 2024-12-01 ENCOUNTER — Telehealth: Payer: Self-pay | Admitting: Cardiovascular Disease

## 2024-12-01 ENCOUNTER — Ambulatory Visit: Admitting: Cardiology

## 2024-12-01 NOTE — Telephone Encounter (Signed)
 Pt's wife requesting c/b regarding sooner appt than rescheduled for or suggestions regarding medication to where they may not have to come in. Please advise

## 2024-12-01 NOTE — Telephone Encounter (Signed)
 Pt spouse calling in regards to changing medication or for sooner appointment. Unable to schedule sooner at this time. Pt was put on Amlodipine  d/t HR not tolerating the Lopressor . Increased Amlodipine  at last OV 11/04/24. 1/21 stopped taking Amlodipine  7.5 due to leg and feet swelling. Stated she doesn't know if it came from the extra dose, but that is the only thing that has changed. Last few nights BP: 139/67 131/62 128/60  Will send to Dr Barbaraann and Jon Hails, PA for any further recommendations at this time. Pt and spouse verbalizes understanding of plan. Encouraged pt to stay hydrated also, as he stated he drinks coffee soda throughout the day with maybe one glass of water at night with medications.

## 2024-12-02 NOTE — Telephone Encounter (Signed)
 Called pt. Relayed message, pt verbalized understanding but further questions at this time.

## 2024-12-23 ENCOUNTER — Ambulatory Visit: Admitting: Emergency Medicine

## 2025-01-22 ENCOUNTER — Ambulatory Visit: Admitting: Cardiovascular Disease

## 2025-02-09 ENCOUNTER — Ambulatory Visit: Payer: Self-pay | Admitting: Neurology
